# Patient Record
Sex: Male | Born: 1965
Health system: Southern US, Community
[De-identification: ages and names within clinical notes are randomized; demographics above are authoritative.]

## PROBLEM LIST (undated history)

## (undated) DIAGNOSIS — J45909 Unspecified asthma, uncomplicated: Secondary | ICD-10-CM

## (undated) DIAGNOSIS — B1920 Unspecified viral hepatitis C without hepatic coma: Secondary | ICD-10-CM

## (undated) DIAGNOSIS — F1010 Alcohol abuse, uncomplicated: Secondary | ICD-10-CM

## (undated) DIAGNOSIS — F1110 Opioid abuse, uncomplicated: Secondary | ICD-10-CM

## (undated) DIAGNOSIS — F1410 Cocaine abuse, uncomplicated: Secondary | ICD-10-CM

## (undated) HISTORY — PX: HAND SURGERY: SHX662

---

## 1999-03-24 ENCOUNTER — Emergency Department (HOSPITAL_COMMUNITY): Admission: EM | Admit: 1999-03-24 | Discharge: 1999-03-24 | Payer: Self-pay | Admitting: Emergency Medicine

## 1999-03-24 ENCOUNTER — Encounter: Payer: Self-pay | Admitting: Emergency Medicine

## 1999-03-25 ENCOUNTER — Encounter (INDEPENDENT_AMBULATORY_CARE_PROVIDER_SITE_OTHER): Payer: Self-pay | Admitting: Specialist

## 1999-03-25 ENCOUNTER — Ambulatory Visit (HOSPITAL_COMMUNITY): Admission: RE | Admit: 1999-03-25 | Discharge: 1999-03-25 | Payer: Self-pay | Admitting: Orthopedic Surgery

## 1999-03-25 ENCOUNTER — Encounter: Payer: Self-pay | Admitting: Orthopedic Surgery

## 2004-12-14 ENCOUNTER — Emergency Department (HOSPITAL_COMMUNITY): Admission: EM | Admit: 2004-12-14 | Discharge: 2004-12-14 | Payer: Self-pay | Admitting: Emergency Medicine

## 2007-01-16 ENCOUNTER — Emergency Department (HOSPITAL_COMMUNITY): Admission: EM | Admit: 2007-01-16 | Discharge: 2007-01-16 | Payer: Self-pay | Admitting: Emergency Medicine

## 2009-04-28 ENCOUNTER — Emergency Department (HOSPITAL_COMMUNITY): Admission: EM | Admit: 2009-04-28 | Discharge: 2009-04-28 | Payer: Self-pay | Admitting: Emergency Medicine

## 2010-11-30 ENCOUNTER — Emergency Department (HOSPITAL_BASED_OUTPATIENT_CLINIC_OR_DEPARTMENT_OTHER)
Admission: EM | Admit: 2010-11-30 | Discharge: 2010-11-30 | Disposition: A | Discharge status: Home / Self Care | Payer: Self-pay | Attending: Emergency Medicine | Admitting: Emergency Medicine

## 2010-11-30 DIAGNOSIS — J45909 Unspecified asthma, uncomplicated: Secondary | ICD-10-CM | POA: Insufficient documentation

## 2010-11-30 DIAGNOSIS — K2990 Gastroduodenitis, unspecified, without bleeding: Secondary | ICD-10-CM | POA: Insufficient documentation

## 2010-11-30 DIAGNOSIS — F17200 Nicotine dependence, unspecified, uncomplicated: Secondary | ICD-10-CM | POA: Insufficient documentation

## 2010-11-30 DIAGNOSIS — R109 Unspecified abdominal pain: Secondary | ICD-10-CM | POA: Insufficient documentation

## 2010-11-30 DIAGNOSIS — K2970 Gastritis, unspecified, without bleeding: Secondary | ICD-10-CM | POA: Insufficient documentation

## 2010-11-30 LAB — COMPREHENSIVE METABOLIC PANEL
Alkaline Phosphatase: 73 U/L (ref 39–117)
BUN: 10 mg/dL (ref 6–23)
CO2: 27 mEq/L (ref 19–32)
Chloride: 102 mEq/L (ref 96–112)
Creatinine, Ser: 0.9 mg/dL (ref 0.4–1.5)
GFR calc non Af Amer: >60 mL/min (ref >60)
Glucose, Bld: 87 mg/dL (ref 70–99)
Total Bilirubin: 0.6 mg/dL (ref 0.3–1.2)

## 2010-11-30 LAB — URINALYSIS, ROUTINE W REFLEX MICROSCOPIC
Bilirubin Urine: NEGATIVE
Glucose, UA: NEGATIVE mg/dL
Hgb urine dipstick: NEGATIVE
Ketones, ur: NEGATIVE mg/dL
Nitrite: NEGATIVE
Protein, ur: NEGATIVE mg/dL
Specific Gravity, Urine: 1.024 (ref 1.005–1.030)
Urobilinogen, UA: 0.2 mg/dL (ref 0.0–1.0)
pH: 6 (ref 5.0–8.0)

## 2010-11-30 LAB — DIFFERENTIAL
Basophils Absolute: 0 10*3/uL (ref 0.0–0.1)
Basophils Relative: 0 % (ref 0–1)
Eosinophils Absolute: 0 10*3/uL (ref 0.0–0.7)
Eosinophils Relative: 0 % (ref 0–5)
Lymphocytes Relative: 27 % (ref 12–46)
Lymphs Abs: 3 10*3/uL (ref 0.7–4.0)
Monocytes Absolute: 0.8 10*3/uL (ref 0.1–1.0)
Monocytes Relative: 7 % (ref 3–12)
Neutro Abs: 7.3 10*3/uL (ref 1.7–7.7)
Neutrophils Relative %: 66 % (ref 43–77)

## 2010-11-30 LAB — CBC
HCT: 40.8 % (ref 39.0–52.0)
Hemoglobin: 14.1 g/dL (ref 13.0–17.0)
MCH: 21.6 pg — ABNORMAL LOW (ref 26.0–34.0)
MCV: 62.6 fL — ABNORMAL LOW (ref 78.0–100.0)
RBC: 6.52 MIL/uL — ABNORMAL HIGH (ref 4.22–5.81)

## 2011-01-05 ENCOUNTER — Emergency Department (HOSPITAL_COMMUNITY)
Admission: EM | Admit: 2011-01-05 | Discharge: 2011-01-05 | Disposition: A | Discharge status: Home / Self Care | Payer: Self-pay | Attending: Emergency Medicine | Admitting: Emergency Medicine

## 2011-01-05 DIAGNOSIS — R209 Unspecified disturbances of skin sensation: Secondary | ICD-10-CM | POA: Insufficient documentation

## 2011-01-05 DIAGNOSIS — M549 Dorsalgia, unspecified: Secondary | ICD-10-CM | POA: Insufficient documentation

## 2011-01-05 DIAGNOSIS — M5430 Sciatica, unspecified side: Secondary | ICD-10-CM | POA: Insufficient documentation

## 2013-08-23 ENCOUNTER — Encounter (HOSPITAL_COMMUNITY): Payer: Self-pay | Admitting: Emergency Medicine

## 2013-08-23 ENCOUNTER — Emergency Department (HOSPITAL_COMMUNITY)
Admission: EM | Admit: 2013-08-23 | Discharge: 2013-08-23 | Disposition: A | Discharge status: Home / Self Care | Payer: Self-pay | Attending: Emergency Medicine | Admitting: Emergency Medicine

## 2013-08-23 ENCOUNTER — Emergency Department (HOSPITAL_COMMUNITY): Payer: Self-pay

## 2013-08-23 DIAGNOSIS — Z880 Allergy status to penicillin: Secondary | ICD-10-CM | POA: Insufficient documentation

## 2013-08-23 DIAGNOSIS — Z79899 Other long term (current) drug therapy: Secondary | ICD-10-CM | POA: Insufficient documentation

## 2013-08-23 DIAGNOSIS — J45909 Unspecified asthma, uncomplicated: Secondary | ICD-10-CM | POA: Insufficient documentation

## 2013-08-23 DIAGNOSIS — M549 Dorsalgia, unspecified: Secondary | ICD-10-CM

## 2013-08-23 DIAGNOSIS — R509 Fever, unspecified: Secondary | ICD-10-CM | POA: Insufficient documentation

## 2013-08-23 DIAGNOSIS — R110 Nausea: Secondary | ICD-10-CM | POA: Insufficient documentation

## 2013-08-23 DIAGNOSIS — N390 Urinary tract infection, site not specified: Secondary | ICD-10-CM | POA: Insufficient documentation

## 2013-08-23 DIAGNOSIS — R000 Tachycardia, unspecified: Secondary | ICD-10-CM | POA: Insufficient documentation

## 2013-08-23 HISTORY — DX: Unspecified asthma, uncomplicated: J45.909

## 2013-08-23 LAB — URINALYSIS, ROUTINE W REFLEX MICROSCOPIC
Bilirubin Urine: NEGATIVE
Glucose, UA: NEGATIVE mg/dL
KETONES UR: NEGATIVE mg/dL
NITRITE: NEGATIVE
PH: 6 (ref 5.0–8.0)
PROTEIN: NEGATIVE mg/dL
Specific Gravity, Urine: 1.026 (ref 1.005–1.030)
Urobilinogen, UA: 0.2 mg/dL (ref 0.0–1.0)

## 2013-08-23 LAB — CBC
HCT: 44.2 % (ref 39.0–52.0)
Hemoglobin: 14.5 g/dL (ref 13.0–17.0)
MCH: 21.2 pg — ABNORMAL LOW (ref 26.0–34.0)
MCHC: 32.8 g/dL (ref 30.0–36.0)
MCV: 64.6 fL — ABNORMAL LOW (ref 78.0–100.0)
Platelets: 210 K/uL (ref 150–400)
RBC: 6.84 MIL/uL — ABNORMAL HIGH (ref 4.22–5.81)
RDW: 16.2 % — ABNORMAL HIGH (ref 11.5–15.5)
WBC: 6.4 K/uL (ref 4.0–10.5)

## 2013-08-23 LAB — COMPREHENSIVE METABOLIC PANEL WITH GFR
ALT: 33 U/L (ref 0–53)
AST: 29 U/L (ref 0–37)
Albumin: 3.8 g/dL (ref 3.5–5.2)
Alkaline Phosphatase: 70 U/L (ref 39–117)
BUN: 9 mg/dL (ref 6–23)
CO2: 27 meq/L (ref 19–32)
Calcium: 9.4 mg/dL (ref 8.4–10.5)
Chloride: 103 meq/L (ref 96–112)
Creatinine, Ser: 1.05 mg/dL (ref 0.50–1.35)
GFR calc Af Amer: >90 mL/min
GFR calc non Af Amer: 83 mL/min — ABNORMAL LOW
Glucose, Bld: 109 mg/dL — ABNORMAL HIGH (ref 70–99)
Potassium: 3.8 meq/L (ref 3.7–5.3)
Sodium: 141 meq/L (ref 137–147)
Total Bilirubin: 0.3 mg/dL (ref 0.3–1.2)
Total Protein: 7.4 g/dL (ref 6.0–8.3)

## 2013-08-23 LAB — URINE MICROSCOPIC-ADD ON

## 2013-08-23 MED ORDER — CIPROFLOXACIN HCL 500 MG PO TABS: 500.0000 mg | ORAL_TABLET | Freq: Two times a day (BID) | ORAL | Status: DC

## 2013-08-23 MED ORDER — OXYCODONE-ACETAMINOPHEN 5-325 MG PO TABS: 1.0000 | ORAL_TABLET | Freq: Four times a day (QID) | ORAL | Status: DC | PRN

## 2013-08-23 MED ORDER — SODIUM CHLORIDE 0.9 % IV BOLUS (SEPSIS)
1000.0000 mL | Freq: Once | INTRAVENOUS | Status: AC
  Administered 2013-08-23: 1000 mL via INTRAVENOUS

## 2013-08-23 MED ORDER — ONDANSETRON HCL 4 MG/2ML IJ SOLN
4.0000 mg | Freq: Once | INTRAMUSCULAR | Status: AC
  Administered 2013-08-23: 4 mg via INTRAVENOUS
  Filled 2013-08-23: qty 2

## 2013-08-23 MED ORDER — HYDROMORPHONE HCL PF 1 MG/ML IJ SOLN
1.0000 mg | Freq: Once | INTRAMUSCULAR | Status: AC
  Administered 2013-08-23: 1 mg via INTRAVENOUS
  Filled 2013-08-23: qty 1

## 2013-08-23 NOTE — ED Provider Notes (Signed)
CSN: [id number]     Arrival date & time 08/23/13  0744 History   First MD Initiated Contact with Patient 08/23/13 0757     Chief Complaint  Patient presents with  . Flank Pain     (Consider location/radiation/quality/duration/timing/severity/associated sxs/prior Treatment) Patient is a 48 y.o. male presenting with flank pain and abdominal pain.  Flank Pain This is a new problem. The current episode started more than 1 week ago. The problem occurs constantly. The problem has been gradually worsening. Associated symptoms include abdominal pain. Pertinent negatives include no chest pain. Nothing aggravates the symptoms. Nothing relieves the symptoms. He has tried nothing for the symptoms.  Abdominal Pain Pain location:  L flank and R flank Pain quality: aching and dull   Pain radiates to:  LLQ Pain severity:  Moderate Onset quality:  Gradual Duration:  10 days Timing:  Constant Progression:  Worsening Chronicity:  New Context: not recent illness and not sick contacts   Relieved by:  Nothing Worsened by:  Nothing tried Associated symptoms: fever (subjective last night) and nausea   Associated symptoms: no chest pain and no vomiting     Past Medical History  Diagnosis Date  . Asthma    History reviewed. No pertinent past surgical history. No family history on file. History  Substance Use Topics  . Smoking status: Current Every Day Smoker -- 0.50 packs/day    Types: Cigarettes  . Smokeless tobacco: Not on file  . Alcohol Use: No    Review of Systems  Constitutional: Positive for fever (subjective last night).  Cardiovascular: Negative for chest pain and leg swelling.  Gastrointestinal: Positive for nausea and abdominal pain. Negative for vomiting.  Genitourinary: Positive for flank pain.  All other systems reviewed and are negative.      Allergies  Penicillins  Home Medications   Current Outpatient Rx  Name  Route  Sig  Dispense  Refill  . acyclovir (ZOVIRAX)  400 MG tablet   Oral   Take 400 mg by mouth daily.         Marland Kitchen. esomeprazole (NEXIUM) 20 MG capsule   Oral   Take 20 mg by mouth daily at 12 noon.         . Ginger, Zingiber officinalis, (GINGER ROOT) 250 MG CAPS   Oral   Take 250 mg by mouth daily.         Marland Kitchen. ibuprofen (ADVIL,MOTRIN) 200 MG tablet   Oral   Take 400 mg by mouth every 6 (six) hours as needed for mild pain or moderate pain.         . Multiple Vitamin (MULTIVITAMIN WITH MINERALS) TABS tablet   Oral   Take 1 tablet by mouth daily.         . Phenazopyridine HCl (AZO TABS PO)   Oral   Take 2 tablets by mouth 3 (three) times daily.         . vitamin B-12 (CYANOCOBALAMIN) 100 MCG tablet   Oral   Take 100 mcg by mouth daily.          BP 148/96  Pulse 129  Temp(Src) 98.5 F (36.9 C)  Resp 20  SpO2 100% Physical Exam  Nursing note and vitals reviewed. Constitutional: He is oriented to person, place, and time. He appears well-developed and well-nourished. No distress.  HENT:  Head: Normocephalic and atraumatic.  Mouth/Throat: No oropharyngeal exudate.  Eyes: EOM are normal. Pupils are equal, round, and reactive to light.  Neck: Normal range of  motion. Neck supple.  Cardiovascular: Normal rate and regular rhythm.  Exam reveals no friction rub.   No murmur heard. Pulmonary/Chest: Effort normal and breath sounds normal. No respiratory distress. He has no wheezes. He has no rales.  Abdominal: He exhibits no distension. There is tenderness (LLQ, L flank). There is no rebound.  Musculoskeletal: Normal range of motion. He exhibits no edema.  Neurological: He is alert and oriented to person, place, and time.  Skin: No rash noted. He is not diaphoretic.    ED Course  Procedures (including critical care time) Labs Review Labs Reviewed  CBC - Abnormal; Notable for the following:    RBC 6.84 (*)    MCV 64.6 (*)    MCH 21.2 (*)    RDW 16.2 (*)    All other components within normal limits  COMPREHENSIVE  METABOLIC PANEL - Abnormal; Notable for the following:    Glucose, Bld 109 (*)    GFR calc non Af Amer 83 (*)    All other components within normal limits  URINALYSIS, ROUTINE W REFLEX MICROSCOPIC - Abnormal; Notable for the following:    Color, Urine AMBER (*)    Hgb urine dipstick MODERATE (*)    Leukocytes, UA SMALL (*)    All other components within normal limits  URINE MICROSCOPIC-ADD ON   Imaging Review Ct Abdomen Pelvis Wo Contrast  08/23/2013   CLINICAL DATA:  Bilateral flank pain for 10 days.  Nausea.  EXAM: CT ABDOMEN AND PELVIS WITHOUT CONTRAST  TECHNIQUE: Multidetector CT imaging of the abdomen and pelvis was performed following the standard protocol without intravenous contrast.  COMPARISON:  None.  FINDINGS: The noncontrast CT appearance of the liver, spleen, pancreas, and adrenal glands is within normal limits. Gallbladder mildly contracted.  Kidneys and proximal ureters unremarkable. No distal ureteral calculus or bladder calculus observed.  Appendix normal.  Incidental calcifications in the central zone of the prostate gland.  Bridging spurring of the sacroiliac joints noted.  IMPRESSION: 1. 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 2. No acute findings to explain the patient's flank pain.   Electronically Signed   By: Herbie Baltimore M.D.   On: 08/23/2013 09:15    EKG Interpretation   None       MDM   Final diagnoses:  Back pain  UTI (lower urinary tract infection)    26M with no history of kidney stones presents with bilateral flank pain. Worse on the left. Subjective fever last night with nausea. No vomiting. He is having difficulty urinating but no dysuria. Here he is afebrile but tachycardic in the high 120s. Normal pressures. Patient is uncomfortable on exam but in no acute distress. He has no CVA tenderness bilaterally. He has some left lower quadrant and left flank pain on palpation. Concern for kidney stone. Will obtain stone study. Pain medicine,  fluids, nausea medicine given. On re-exam, feeling much better. Patient's pain is located in his musculature of his lower back. CT normal. No spinal pain. Likely musculoskeletal back pain. Will give Cipro with his difficulty urinating and small leukocytes on UA. Stable for discharge.  I have reviewed all labs and imaging and considered them in my medical decision making.   Dagmar Hait, MD 08/23/13 1022

## 2013-08-23 NOTE — Discharge Instructions (Signed)
Back Exercises Back exercises help treat and prevent back injuries. The goal of back exercises is to increase the strength of your abdominal and back muscles and the flexibility of your back. These exercises should be started when you no longer have back pain. Back exercises include:  Pelvic Tilt. Lie on your back with your knees bent. Tilt your pelvis until the lower part of your back is against the floor. Hold this position 5 to 10 sec and repeat 5 to 10 times.  Knee to Chest. Pull first 1 knee up against your chest and hold for 20 to 30 seconds, repeat this with the other knee, and then both knees. This may be done with the other leg straight or bent, whichever feels better.  Sit-Ups or Curl-Ups. Bend your knees 90 degrees. Start with tilting your pelvis, and do a partial, slow sit-up, lifting your trunk only 30 to 45 degrees off the floor. Take at least 2 to 3 seconds for each sit-up. Do not do sit-ups with your knees out straight. If partial sit-ups are difficult, simply do the above but with only tightening your abdominal muscles and holding it as directed.  Hip-Lift. Lie on your back with your knees flexed 90 degrees. Push down with your feet and shoulders as you raise your hips a couple inches off the floor; hold for 10 seconds, repeat 5 to 10 times.  Back arches. Lie on your stomach, propping yourself up on bent elbows. Slowly press on your hands, causing an arch in your low back. Repeat 3 to 5 times. Any initial stiffness and discomfort should lessen with repetition over time.  Shoulder-Lifts. Lie face down with arms beside your body. Keep hips and torso pressed to floor as you slowly lift your head and shoulders off the floor. Do not overdo your exercises, especially in the beginning. Exercises may cause you some mild back discomfort which lasts for a few minutes; however, if the pain is more severe, or lasts for more than 15 minutes, do not continue exercises until you see your caregiver.  Improvement with exercise therapy for back problems is slow.  See your caregivers for assistance with developing a proper back exercise program. Document Released: 08/05/2004 Document Revised: 09/20/2011 Document Reviewed: 04/29/2011 [facility] Patient Information 2014 [place], [place].  Urinary Tract Infection Urinary tract infections (UTIs) can develop anywhere along your urinary tract. Your urinary tract is your body's drainage system for removing wastes and extra water. Your urinary tract includes two kidneys, two ureters, a bladder, and a urethra. Your kidneys are a pair of bean-shaped organs. Each kidney is about the size of your fist. They are located below your ribs, one on each side of your spine. CAUSES Infections are caused by microbes, which are microscopic organisms, including fungi, viruses, and bacteria. These organisms are so small that they can only be seen through a microscope. Bacteria are the microbes that most commonly cause UTIs. SYMPTOMS  Symptoms of UTIs may vary by age and gender of the patient and by the location of the infection. Symptoms in young women typically include a frequent and intense urge to urinate and a painful, burning feeling in the bladder or urethra during urination. Older women and men are more likely to be tired, shaky, and weak and have muscle aches and abdominal pain. A fever may mean the infection is in your kidneys. Other symptoms of a kidney infection include pain in your back or sides below the ribs, nausea, and vomiting. DIAGNOSIS To diagnose a  UTI, your caregiver will ask you about your symptoms. Your caregiver also will ask to provide a urine sample. The urine sample will be tested for bacteria and white blood cells. White blood cells are made by your body to help fight infection. TREATMENT  Typically, UTIs can be treated with medication. Because most UTIs are caused by a bacterial infection, they usually can be treated with the use of antibiotics. The  choice of antibiotic and length of treatment depend on your symptoms and the type of bacteria causing your infection. HOME CARE INSTRUCTIONS  If you were prescribed antibiotics, take them exactly as your caregiver instructs you. Finish the medication even if you feel better after you have only taken some of the medication.  Drink enough water and fluids to keep your urine clear or pale yellow.  Avoid caffeine, tea, and carbonated beverages. They tend to irritate your bladder.  Empty your bladder often. Avoid holding urine for long periods of time.  Empty your bladder before and after sexual intercourse.  After a bowel movement, women should cleanse from front to back. Use each tissue only once. SEEK MEDICAL CARE IF:   You have back pain.  You develop a fever.  Your symptoms do not begin to resolve within 3 days. SEEK IMMEDIATE MEDICAL CARE IF:   You have severe back pain or lower abdominal pain.  You develop chills.  You have nausea or vomiting.  You have continued burning or discomfort with urination. MAKE SURE YOU:   Understand these instructions.  Will watch your condition.  Will get help right away if you are not doing well or get worse. Document Released: 04/07/2005 Document Revised: 12/28/2011 Document Reviewed: 08/06/2011 [facility] Patient Information 2014 [place], [place].   Emergency Department Resource Guide 1) Find a Doctor and Pay Out of Pocket Although you won't have to find out who is covered by your insurance plan, it is a good idea to ask around and get recommendations. You will then need to call the office and see if the doctor you have chosen will accept you as a new patient and what types of options they offer for patients who are self-pay. Some doctors offer discounts or will set up payment plans for their patients who do not have insurance, but you will need to ask so you aren't surprised when you get to your appointment.  2) Contact Your Local Health  Department Not all health departments have doctors that can see patients for sick visits, but many do, so it is worth a call to see if yours does. If you don't know where your local health department is, you can check in your phone book. The CDC also has a tool to help you locate your state's health department, and many state websites also have listings of all of their local health departments.  3) Find a Walk-in Clinic If your illness is not likely to be very severe or complicated, you may want to try a walk in clinic. These are popping up all over the country in pharmacies, drugstores, and shopping centers. They're usually staffed by nurse practitioners or physician assistants that have been trained to treat common illnesses and complaints. They're usually fairly quick and inexpensive. However, if you have serious medical issues or chronic medical problems, these are probably not your best option.  No Primary Care Doctor: - Call Health Connect at  [phone] - they can help you locate a primary care doctor that  accepts your insurance, provides certain services, etc. -  Physician Referral Service- [phone]  Chronic Pain Problems: Organization         Address  Phone   Notes  [facility]  [phone] Patients need to be referred by their primary care doctor.   Medication Assistance: Organization         Address  Phone   Notes  [facility] Medication [facility] [address] [place], [place] [address] [phone] --Must be a resident of [facility] -- Must have NO insurance coverage whatsoever (no Medicaid/ Medicare, etc.) -- The pt. MUST have a primary care doctor that directs their care regularly and follows them in the community   MedAssist  [phone]   [employer]  [phone]    Agencies that provide inexpensive medical care: Organization         Address  Phone   Notes  [facility]  [phone]   [facility]    [phone]   [facility] [address] [place], [place] [address] [phone]   [facility] [address], [place] [phone]) [phone]   Planned Parenthood    [phone](336) [phone]   [facility]    [phone]) [phone]   Community Health and [facility]  [address], [place] Phone:  [phone], Fax:  [phone]) [phone] Hours of Operation:  9 am - 6 pm, M-F.  Also accepts Medicaid/Medicare and self-pay.  [facility]  [address], [place] Phone: [phone]) [phone], Fax: [phone]. Hours of Operation:  8:30 am - 5:30 pm, M-F.  Also accepts Medicaid and self-pay.  [facility] [place] [address], [place] Phone: [phone](336) [phone]   [facility] [address], [place], [place] [phone], Ext. [phone] Mondays & Thursdays: 7-9 AM.  First 15 patients are seen on a first come, first serve basis.    Medicaid-accepting [facility] Providers:  Organization         Address  Phone   Notes  [facility] [address], [place] [phone] Also accepts self-pay patients.  [facility] [address], [place]  [phone]   [facility] [address], [place] [phone]   [facility] [address] [place] [address], [place] [phone]   Renaye RakersVeita Bland [address], [place]   [phone] Only accepts [place] Access [place] patients after they have their name applied to their card.   Self-Pay (no insurance) in [facility]:  Organization         Address  Phone   Notes  Sickle Cell Patients, [facility] [address], [place] [phone]   [facility] [address], [place] [phone]   [facility] [place]  [address],  [place] [phone]   [facility]/Dr. Osei-Bonsu  [address] [place] [address], [place] or [phone], [place] [phone]) [phone] Phone number for both [place] and [place] locations is the same.  Urgent Medical and [facility] [address], [place] [phone]) [phone]   [facility] [place] [address] [place] [address], [place] or [address] [phone]   [facility] [address], [place] [phone]) [phone],  phone; [phone], fax Sees patients 1st and 3rd Saturday of every month.  Must not qualify for public or private insurance (i.e. Medicaid, Medicare, [place] Health Choice, Veterans' Benefits)  Household income should be no more than 200% of the poverty level The clinic cannot treat you if you are pregnant or think you are pregnant  Sexually transmitted diseases are not treated at the clinic.    Dental Care: Organization         Address  Phone  Notes  [facility] of [facility] [phone] Accepts children up to age 21 who are enrolled in [place] or [place]; pregnant women with a Medicaid card; and children who have applied for Medicaid or [place] Health Choice, but were declined, whose parents can pay a reduced fee at time of service.  [facility] of [facility]  [address], [place] [phone] Accepts children up to age 68 who are enrolled in [place] or [place]; pregnant women with a Medicaid card; and children who have applied for Medicaid or [place] Health Choice, but were declined, whose parents can pay a reduced fee at time of service.  [place] Adult Dental Access PROGRAM  [place] [phone] Patients are seen by appointment only. Walk-ins are not accepted. [place] will see patients 77 years of age and older. Monday - Tuesday (8am-5pm) Most Wednesdays  (8:30-5pm) $30 per visit, cash only  [facility] Adult Dental Access PROGRAM  [address], [facility] [phone] Patients are seen by appointment only. Walk-ins are not accepted. [place] will see patients 28 years of age and older. One Wednesday Evening (Monthly: Volunteer Based).  $30 per visit, cash only  [place]  [phone] for adults; Children under age 32, call Graduate Pediatric Dentistry at [phone]. Children aged 54-14, please call [phone] to request a pediatric application.  Dental services are provided in all areas of dental care including fillings, crowns and bridges, complete and partial dentures, implants, gum treatment, root canals, and extractions. Preventive care is also provided. Treatment is provided to both adults and children. Patients are selected via a lottery and there is often a waiting list.   [facility] [address] [place]  [phone] [email or web address]   [facility] [address] [place], [place] [phone], Ext. [phone] Second and Fourth Thursday of each month, opens at 6:30 AM; Clinic ends at 9 AM.  Patients are seen on a first-come first-served basis, and a limited number are seen during each clinic.   [facility]  [address] Hillard Danker [place], [place] [phone]   Eligibility Requirements You must have lived in [place], [place], or [place] counties for at least the last three months.   You cannot be eligible for state or federal sponsored [employer], including [employer], [place], or [employer].   You generally cannot be eligible for healthcare insurance through your employer.    How to apply: Eligibility screenings are held every Tuesday and Wednesday afternoon from 1:00 pm until 4:00 pm. You do not need an appointment for the interview!  [facility] [address] [place], [place]   [place]  [place] Department  Page  [phone]    Behavioral Health Resources in  the Community: Intensive Outpatient Programs Organization         Address  Phone  Notes  [place]. [address], [place], [place] [phone]   [facility] [address], [place], [place]   ADS: Alcohol & Drug Svcs [address] [place] [address] [place], [place]   [place] [address]  [place], [place] or [phone]   Substance Abuse Resources Organization         Address  Phone  Notes  Alcohol and Drug Services  5047351452   [place]  [phone]   The [place]   Chinita Pester  [phone]   Residential & Outpatient Substance Abuse Program  [phone]   Psychological Services Organization         Address  Phone  Notes  [facility] [place]  [place]  [phone]   [place] [address] [place] or [phone]    Mobile Crisis Teams Organization         Address  Phone  Notes  [facility], Mobile Crisis Care Unit  [phone]   [facility]  [address] [place], [place]   [place] Levels [address], [place] [phone]    Self-Help/Support Groups Organization         Address  Phone             Notes  [place]. of [place] - variety of support groups  Bonita Call for more information  Narcotics Anonymous (NA), [facility]aring [facility] [address], [employer] [place]  2 meetings at this location   Special educational needs teacher         Address  Phone  Notes  ASAP Residential Treatment [place],    [place]  [phone]   [facility]  [address] [place] 694854, [place], [place]   [place], [place] [phone] Admissions: 8am-3pm M-F  Incentives Substance [place] [address],    [place], [place] [phone]   [facility] [address] [place], [place], [place]   [facility] [address]  [place], [place]   Insight Programs - Intensive Outpatient [address], [place], [place]   [facility] ([place].) [place].,  [place], [place] [phone] or [phone]   [facility] Services (RTS) [address] [place], [place] Accepts Medicaid  [facility] [place] [address]  [place] [phone] Substance Abuse/Addiction Treatment   [facility] Organization         Address  Phone  Notes  [facility]  [phone]   Domenic Schwab, PhD [address] Arlis Porta [place], [place]   [phone] or [phone]   [place], [place] [phone]   [place] [address] [place], [place] [phone] Insurance/Medicaid/sponsorship through [employer] and Families [address] [place]                                    [place], [place] [phone] [place] [address], [place] [phone]  Dr. Adele Schilder  [phone]   [facility] Dept. 1) [address], [place] 2) [place] 3)  [place] [address], [place] [phone]  [phone]   [place] [phone] or [phone] (After Hours)

## 2013-08-23 NOTE — ED Notes (Signed)
Pt c/o bilateral flank pain x 10 days; frequent urination; doesn't feel like emptying bladder; fever yesterday; pain worse right flank; no hematuria

## 2013-08-23 NOTE — Progress Notes (Signed)
[facility] CL provided pt with a list of primary care resources, ACA information, and a GCC[facility] [place] Card application to help patient establish primary care.  °

## 2015-02-05 ENCOUNTER — Encounter (HOSPITAL_COMMUNITY): Payer: Self-pay | Admitting: Emergency Medicine

## 2015-02-05 ENCOUNTER — Emergency Department (HOSPITAL_COMMUNITY)
Admission: EM | Admit: 2015-02-05 | Discharge: 2015-02-05 | Disposition: A | Discharge status: Home / Self Care | Payer: Self-pay | Attending: Emergency Medicine | Admitting: Emergency Medicine

## 2015-02-05 DIAGNOSIS — Y92481 Parking lot as the place of occurrence of the external cause: Secondary | ICD-10-CM | POA: Insufficient documentation

## 2015-02-05 DIAGNOSIS — R55 Syncope and collapse: Secondary | ICD-10-CM | POA: Insufficient documentation

## 2015-02-05 DIAGNOSIS — Y9389 Activity, other specified: Secondary | ICD-10-CM | POA: Insufficient documentation

## 2015-02-05 DIAGNOSIS — S199XXA Unspecified injury of neck, initial encounter: Secondary | ICD-10-CM | POA: Insufficient documentation

## 2015-02-05 DIAGNOSIS — Z720 Tobacco use: Secondary | ICD-10-CM | POA: Insufficient documentation

## 2015-02-05 DIAGNOSIS — R569 Unspecified convulsions: Secondary | ICD-10-CM | POA: Insufficient documentation

## 2015-02-05 DIAGNOSIS — Z79899 Other long term (current) drug therapy: Secondary | ICD-10-CM | POA: Insufficient documentation

## 2015-02-05 DIAGNOSIS — J45901 Unspecified asthma with (acute) exacerbation: Secondary | ICD-10-CM | POA: Insufficient documentation

## 2015-02-05 DIAGNOSIS — Y998 Other external cause status: Secondary | ICD-10-CM | POA: Insufficient documentation

## 2015-02-05 DIAGNOSIS — Z880 Allergy status to penicillin: Secondary | ICD-10-CM | POA: Insufficient documentation
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
## 2015-03-20 ENCOUNTER — Emergency Department (HOSPITAL_COMMUNITY)
Admission: EM | Admit: 2015-03-20 | Discharge: 2015-03-20 | Disposition: A | Discharge status: Home / Self Care | Payer: Self-pay | Attending: Emergency Medicine | Admitting: Emergency Medicine

## 2015-03-20 ENCOUNTER — Emergency Department (HOSPITAL_COMMUNITY): Payer: Self-pay

## 2015-03-20 ENCOUNTER — Encounter (HOSPITAL_COMMUNITY): Payer: Self-pay

## 2015-03-20 DIAGNOSIS — J45909 Unspecified asthma, uncomplicated: Secondary | ICD-10-CM | POA: Insufficient documentation

## 2015-03-20 DIAGNOSIS — S46001A Unspecified injury of muscle(s) and tendon(s) of the rotator cuff of right shoulder, initial encounter: Secondary | ICD-10-CM | POA: Insufficient documentation

## 2015-03-20 DIAGNOSIS — Y998 Other external cause status: Secondary | ICD-10-CM | POA: Insufficient documentation

## 2015-03-20 DIAGNOSIS — Y9389 Activity, other specified: Secondary | ICD-10-CM | POA: Insufficient documentation

## 2015-03-20 DIAGNOSIS — Z79899 Other long term (current) drug therapy: Secondary | ICD-10-CM | POA: Insufficient documentation

## 2015-03-20 DIAGNOSIS — Z720 Tobacco use: Secondary | ICD-10-CM | POA: Insufficient documentation

## 2015-03-20 DIAGNOSIS — X58XXXA Exposure to other specified factors, initial encounter: Secondary | ICD-10-CM | POA: Insufficient documentation

## 2015-03-20 DIAGNOSIS — Z7951 Long term (current) use of inhaled steroids: Secondary | ICD-10-CM | POA: Insufficient documentation

## 2015-03-20 DIAGNOSIS — Y9289 Other specified places as the place of occurrence of the external cause: Secondary | ICD-10-CM | POA: Insufficient documentation

## 2015-03-20 DIAGNOSIS — Z880 Allergy status to penicillin: Secondary | ICD-10-CM | POA: Insufficient documentation

## 2015-03-20 MED ORDER — FENTANYL CITRATE (PF) 100 MCG/2ML IJ SOLN
50.0000 ug | Freq: Once | INTRAMUSCULAR | Status: AC
  Administered 2015-03-20: 50 ug via INTRAVENOUS
  Filled 2015-03-20: qty 2

## 2015-03-20 MED ORDER — HYDROMORPHONE HCL 1 MG/ML IJ SOLN
1.0000 mg | Freq: Once | INTRAMUSCULAR | Status: AC
  Administered 2015-03-20: 1 mg via INTRAVENOUS
  Filled 2015-03-20: qty 1

## 2015-03-20 MED ORDER — HYDROMORPHONE HCL 1 MG/ML IJ SOLN: 1.0000 mg | Freq: Once | INTRAMUSCULAR | Status: DC

## 2015-03-20 MED ORDER — NAPROXEN 500 MG PO TABS: 500.0000 mg | ORAL_TABLET | Freq: Two times a day (BID) | ORAL | Status: DC

## 2015-03-20 MED ORDER — OXYCODONE-ACETAMINOPHEN 5-325 MG PO TABS: 1.0000 | ORAL_TABLET | Freq: Four times a day (QID) | ORAL | Status: DC | PRN

## 2015-03-20 NOTE — ED Provider Notes (Signed)
CSN: [id number]     Arrival date & time 03/20/15  1713 History   First MD Initiated Contact with Patient 03/20/15 1755     Chief Complaint  Patient presents with  . Shoulder Injury   HPI Patient presents to the emergency room with complaints of right shoulder pain. Patient states he was lifting up a 50 pound bag when he felt a tearing in severe pain in his right shoulder. That time the pain has been severe and he has difficulty moving his right arm. The pain is sharp and resolving down towards his hand. He denies any neck pain. He denies any weakness. Any palpation at all of the shoulder area causes severe pain. Past Medical History  Diagnosis Date  . Asthma    History reviewed. No pertinent past surgical history. History reviewed. No pertinent family history. Social History  Substance Use Topics  . Smoking status: Current Every Day Smoker -- 0.50 packs/day    Types: Cigarettes  . Smokeless tobacco: None  . Alcohol Use: Yes    Review of Systems  All other systems reviewed and are negative.     Allergies  Penicillins  Home Medications   Prior to Admission medications   Medication Sig Start Date End Date Taking? Authorizing Provider  acyclovir (ZOVIRAX) 400 MG tablet Take 400 mg by mouth daily.   Yes Historical Provider, MD  albuterol-ipratropium (COMBIVENT) 18-103 MCG/ACT inhaler Inhale 2 puffs into the lungs every 4 (four) hours as needed for wheezing or shortness of breath.   Yes Historical Provider, MD  Aspirin-Salicylamide-Caffeine (BC HEADACHE POWDER PO) Take 1 packet by mouth 2 (two) times daily as needed (headache).   Yes Historical Provider, MD  Multiple Vitamin (MULTIVITAMIN WITH MINERALS) TABS tablet Take 1 tablet by mouth daily.   Yes Historical Provider, MD  vitamin B-12 (CYANOCOBALAMIN) 100 MCG tablet Take 100 mcg by mouth daily.   Yes Historical Provider, MD  ciprofloxacin (CIPRO) 500 MG tablet Take 1 tablet (500 mg total) by mouth 2 (two) times daily. One po bid x  7 days Patient not taking: Reported on 03/20/2015 08/23/13   Elwin Mocha, MD  oxyCODONE-acetaminophen (PERCOCET) 5-325 MG per tablet Take 1 tablet by mouth every 6 (six) hours as needed for moderate pain. Patient not taking: Reported on 03/20/2015 08/23/13   Elwin Mocha, MD   BP 139/88 mmHg  Pulse 96  Temp(Src) 98.3 F (36.8 C) (Oral)  Resp 18  Wt 175 lb (79.379 kg)  SpO2 98% Physical Exam  Constitutional: He appears well-developed and well-nourished. No distress.  HENT:  Head: Normocephalic and atraumatic.  Right Ear: External ear normal.  Left Ear: External ear normal.  Eyes: Conjunctivae are normal. Right eye exhibits no discharge. Left eye exhibits no discharge. No scleral icterus.  Neck: Neck supple. No tracheal deviation present.  Cardiovascular: Normal rate.   Pulmonary/Chest: Effort normal. No stridor. No respiratory distress.  Musculoskeletal: He exhibits no edema.       Right shoulder: He exhibits decreased range of motion, tenderness and bony tenderness. He exhibits no swelling, no effusion, no crepitus, no deformity, no laceration, normal pulse and normal strength.       Cervical back: Normal. He exhibits no tenderness.  Neurological: He is alert. Cranial nerve deficit: no gross deficits.  Skin: Skin is warm and dry. No rash noted.  Psychiatric: He has a normal mood and affect.  Nursing note and vitals reviewed.   ED Course  Procedures (including critical care time) Labs Review Labs Reviewed -  No data to display  Imaging Review Dg Shoulder Right  03/20/2015   CLINICAL DATA:  49 year old male with right shoulder pain.  EXAM: RIGHT SHOULDER - 2+ VIEW  COMPARISON:  None.  FINDINGS: There is no evidence of fracture or dislocation. There is no evidence of arthropathy or other focal bone abnormality. Soft tissues are unremarkable.  IMPRESSION: No fracture or dislocation.   Electronically Signed   By: Elgie Collard M.D.   On: 03/20/2015 18:26   I have personally reviewed  and evaluated these images and lab results as part of my medical decision-making.  Medications  fentaNYL (SUBLIMAZE) injection 50 mcg (50 mcg Intravenous Given 03/20/15 1750)  HYDROmorphone (DILAUDID) injection 1 mg (1 mg Intravenous Given 03/20/15 1848)    MDM   Final diagnoses:  Rotator cuff injury, right, initial encounter    The patient's x-rays do not show any acute abnormality. I suspect a rotator cuff injury. Patient was given a dose of pain medications in the emergency department. Plan on discharge home with medications for pain, a sling and orthopedic referral.    Linwood Dibbles, MD 03/22/15 [phone]

## 2015-03-20 NOTE — Discharge Instructions (Signed)
Rotator Cuff Injury Rotator cuff injury is any type of injury to the set of muscles and tendons that make up the stabilizing unit of your shoulder. This unit holds the ball of your upper arm bone (humerus) in the socket of your shoulder blade (scapula).  CAUSES Injuries to your rotator cuff most commonly come from sports or activities that cause your arm to be moved repeatedly over your head. Examples of this include throwing, weight lifting, swimming, or racquet sports. Long lasting (chronic) irritation of your rotator cuff can cause soreness and swelling (inflammation), bursitis, and eventual damage to your tendons, such as a tear (rupture). SIGNS AND SYMPTOMS Acute rotator cuff tear:  Sudden tearing sensation followed by severe pain shooting from your upper shoulder down your arm toward your elbow.  Decreased range of motion of your shoulder because of pain and muscle spasm.  Severe pain.  Inability to raise your arm out to the side because of pain and loss of muscle power (large tears). Chronic rotator cuff tear:  Pain that usually is worse at night and may interfere with sleep.  Gradual weakness and decreased shoulder motion as the pain worsens.  Decreased range of motion. Rotator cuff tendinitis:  Deep ache in your shoulder and the outside upper arm over your shoulder.  Pain that comes on gradually and becomes worse when lifting your arm to the side or turning it inward. DIAGNOSIS Rotator cuff injury is diagnosed through a medical history, physical exam, and imaging exam. The medical history helps determine the type of rotator cuff injury. Your health care provider will look at your injured shoulder, feel the injured area, and ask you to move your shoulder in different positions. X-ray exams typically are done to rule out other causes of shoulder pain, such as fractures. MRI is the exam of choice for the most severe shoulder injuries because the images show muscles and tendons.    TREATMENT  Chronic tear:  Medicine for pain, such as acetaminophen or ibuprofen.  Physical therapy and range-of-motion exercises may be helpful in maintaining shoulder function and strength.  Steroid injections into your shoulder joint.  Surgical repair of the rotator cuff if the injury does not heal with noninvasive treatment. Acute tear:  Anti-inflammatory medicines such as ibuprofen and naproxen to help reduce pain and swelling.  A sling to help support your arm and rest your rotator cuff muscles. Long-term use of a sling is not advised. It may cause significant stiffening of the shoulder joint.  Surgery may be considered within a few weeks, especially in younger, active people, to return the shoulder to full function.  Indications for surgical treatment include the following:  Age younger than 60 years.  Rotator cuff tears that are complete.  Physical therapy, rest, and anti-inflammatory medicines have been used for 6-8 weeks, with no improvement.  Employment or sporting activity that requires constant shoulder use. Tendinitis:  Anti-inflammatory medicines such as ibuprofen and naproxen to help reduce pain and swelling.  A sling to help support your arm and rest your rotator cuff muscles. Long-term use of a sling is not advised. It may cause significant stiffening of the shoulder joint.  Severe tendinitis may require:  Steroid injections into your shoulder joint.  Physical therapy.  Surgery. HOME CARE INSTRUCTIONS   Apply ice to your injury:  Put ice in a plastic bag.  Place a towel between your skin and the bag.  Leave the ice on for 20 minutes, 2-3 times a day.  If you   have a shoulder immobilizer (sling and straps), wear it until told otherwise by your health care provider.  You may want to sleep on several pillows or in a recliner at night to lessen swelling and pain.  Only take over-the-counter or prescription medicines for pain, discomfort, or fever as  directed by your health care provider.  Do simple hand squeezing exercises with a soft rubber ball to decrease hand swelling. SEEK MEDICAL CARE IF:   Your shoulder pain increases, or new pain or numbness develops in your arm, hand, or fingers.  Your hand or fingers are colder than your other hand. SEEK IMMEDIATE MEDICAL CARE IF:   Your arm, hand, or fingers are numb or tingling.  Your arm, hand, or fingers are increasingly swollen and painful, or they turn white or blue. MAKE SURE YOU:  Understand these instructions.  Will watch your condition.  Will get help right away if you are not doing well or get worse. Document Released: 06/25/2000 Document Revised: 07/03/2013 Document Reviewed: 02/07/2013 Exit[employer] Patient Information 2015 [employer]. This information is not intended to replace advice given to you by your health care provider. Make sure you discuss any questions you have with your health care provider.  

## 2015-03-20 NOTE — ED Notes (Signed)
Right shoulder deformity s/p picking up 50 lbs bag.

## 2015-03-22 ENCOUNTER — Emergency Department (HOSPITAL_COMMUNITY)
Admission: EM | Admit: 2015-03-22 | Discharge: 2015-03-22 | Discharge status: Against Medical Advice | Payer: Self-pay | Attending: Emergency Medicine | Admitting: Emergency Medicine

## 2015-03-22 ENCOUNTER — Encounter (HOSPITAL_COMMUNITY): Payer: Self-pay | Admitting: Emergency Medicine

## 2015-03-22 DIAGNOSIS — R200 Anesthesia of skin: Secondary | ICD-10-CM | POA: Insufficient documentation

## 2015-03-22 DIAGNOSIS — J45909 Unspecified asthma, uncomplicated: Secondary | ICD-10-CM | POA: Insufficient documentation

## 2015-03-22 DIAGNOSIS — Z720 Tobacco use: Secondary | ICD-10-CM | POA: Insufficient documentation

## 2015-03-22 DIAGNOSIS — M79601 Pain in right arm: Secondary | ICD-10-CM | POA: Insufficient documentation

## 2015-03-22 NOTE — ED Notes (Signed)
Pt reports to registration he is leaving

## 2015-03-22 NOTE — ED Notes (Addendum)
Pt was seen on 9/8 here for "torn rotator cuff'. Pt states it has gotten worse, c/o right arm pain and numbness.

## 2015-07-14 ENCOUNTER — Emergency Department (HOSPITAL_COMMUNITY)
Admission: EM | Admit: 2015-07-14 | Discharge: 2015-07-14 | Disposition: A | Discharge status: Home / Self Care | Payer: Self-pay | Attending: Emergency Medicine | Admitting: Emergency Medicine

## 2015-07-14 ENCOUNTER — Encounter (HOSPITAL_COMMUNITY): Payer: Self-pay | Admitting: Emergency Medicine

## 2015-07-14 DIAGNOSIS — Z791 Long term (current) use of non-steroidal anti-inflammatories (NSAID): Secondary | ICD-10-CM | POA: Insufficient documentation

## 2015-07-14 DIAGNOSIS — J45909 Unspecified asthma, uncomplicated: Secondary | ICD-10-CM | POA: Insufficient documentation

## 2015-07-14 DIAGNOSIS — F17210 Nicotine dependence, cigarettes, uncomplicated: Secondary | ICD-10-CM | POA: Insufficient documentation

## 2015-07-14 DIAGNOSIS — Z880 Allergy status to penicillin: Secondary | ICD-10-CM | POA: Insufficient documentation

## 2015-07-14 DIAGNOSIS — Z79899 Other long term (current) drug therapy: Secondary | ICD-10-CM | POA: Insufficient documentation

## 2015-07-14 DIAGNOSIS — L03113 Cellulitis of right upper limb: Secondary | ICD-10-CM | POA: Insufficient documentation

## 2015-07-14 MED ORDER — CEPHALEXIN 500 MG PO CAPS: 500.0000 mg | ORAL_CAPSULE | Freq: Four times a day (QID) | ORAL | Status: DC

## 2015-07-14 MED ORDER — LIDOCAINE HCL 2 % IJ SOLN
INTRAMUSCULAR | Status: AC
  Filled 2015-07-14: qty 20

## 2015-07-14 MED ORDER — CEFTRIAXONE SODIUM 1 G IJ SOLR
1.0000 g | Freq: Once | INTRAMUSCULAR | Status: AC
  Administered 2015-07-14: 1 g via INTRAMUSCULAR
  Filled 2015-07-14: qty 10

## 2015-07-14 MED ORDER — LIDOCAINE-EPINEPHRINE (PF) 2 %-1:200000 IJ SOLN: 20.0000 mL | Freq: Once | INTRAMUSCULAR | Status: DC

## 2015-07-14 MED ORDER — HYDROCODONE-ACETAMINOPHEN 5-325 MG PO TABS: 2.0000 | ORAL_TABLET | ORAL | Status: DC | PRN

## 2015-07-14 MED ORDER — SULFAMETHOXAZOLE-TRIMETHOPRIM 800-160 MG PO TABS: 1.0000 | ORAL_TABLET | Freq: Two times a day (BID) | ORAL | Status: AC

## 2015-07-14 MED ORDER — LIDOCAINE-EPINEPHRINE 2 %-1:100000 IJ SOLN
INTRAMUSCULAR | Status: AC
  Administered 2015-07-14: 1 mL
  Filled 2015-07-14: qty 1

## 2015-07-14 MED ORDER — HYDROCODONE-ACETAMINOPHEN 5-325 MG PO TABS
1.0000 | ORAL_TABLET | Freq: Once | ORAL | Status: AC
  Administered 2015-07-14: 1 via ORAL
  Filled 2015-07-14: qty 1

## 2015-07-14 NOTE — ED Provider Notes (Signed)
CSN: [id number]     Arrival date & time 07/14/15  1309 History  By signing my name below, I, [facility] Randall, attest that this documentation has been prepared under the direction and in the presence of Devin StanleyJamie Chantee Cerino, PA-C. Electronically Signed: Randell PatientMarrissa Randall, ED Scribe. 07/14/2015. 2:47 PM.   Chief Complaint  Patient presents with  . Abscess   The history is provided by the patient. No language interpreter was used.   HPI Comments: Devin Randall is a 50 y.o. male who presents to the Emergency Department complaining of a constant, moderately painful, gradually worsening, pruritic area of swelling to the right medial forearm onset 2 days ago after a possible insect bite. 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 He notes that he did see spiders in the area but denies seeing any insects bite him. He endorses itchiness that same night and gradually spreading erythema around the area, heat, pressure, pain, and white, creamy, thick discharge from the area this morning. Per patient, he has cleaned the area, applied polysporin, liquid Band-Aid and an adhesive bandage without relief. He denies fever.  Past Medical History  Diagnosis Date  . Asthma    History reviewed. No pertinent past surgical history. History reviewed. No pertinent family history. Social History  Substance Use Topics  . Smoking status: Current Every Day Smoker -- 0.50 packs/day    Types: Cigarettes  . Smokeless tobacco: None  . Alcohol Use: Yes    Review of Systems  Constitutional: Negative for fever.  Skin: Positive for color change (Erythema to surrounding area on right medial forearm) and wound (Abscess to right medial forearm, lesion in center of abscess).      Allergies  Penicillins  Home Medications   Prior to Admission medications   Medication Sig Start Date End Date Taking? Authorizing Provider  acyclovir  (ZOVIRAX) 400 MG tablet Take 400 mg by mouth daily.    Historical Provider, MD  albuterol-ipratropium (COMBIVENT) 18-103 MCG/ACT inhaler Inhale 2 puffs into the lungs every 4 (four) hours as needed for wheezing or shortness of breath.    Historical Provider, MD  cephALEXin (KEFLEX) 500 MG capsule Take 1 capsule (500 mg total) by mouth 4 (four) times daily. 07/14/15   Chase PicketJaime Pilcher Kyna Blahnik, PA-C  HYDROcodone-acetaminophen (NORCO/VICODIN) 5-325 MG tablet Take 2 tablets by mouth every 4 (four) hours as needed. 07/14/15   Chase PicketJaime Pilcher Dymond Gutt, PA-C  Multiple Vitamin (MULTIVITAMIN WITH MINERALS) TABS tablet Take 1 tablet by mouth daily.    Historical Provider, MD  naproxen (NAPROSYN) 500 MG tablet Take 1 tablet (500 mg total) by mouth 2 (two) times daily. 03/20/15   Linwood DibblesJon Knapp, MD  oxyCODONE-acetaminophen (PERCOCET) 5-325 MG per tablet Take 1 tablet by mouth every 6 (six) hours as needed for moderate pain. 03/20/15   Linwood DibblesJon Knapp, MD  sulfamethoxazole-trimethoprim (BACTRIM DS,SEPTRA DS) 800-160 MG tablet Take 1 tablet by mouth 2 (two) times daily. 07/14/15 07/21/15  Chase PicketJaime Pilcher Denina Rieger, PA-C  vitamin B-12 (CYANOCOBALAMIN) 100 MCG tablet Take 100 mcg by mouth daily.    Historical Provider, MD   BP 141/98 mmHg  Pulse 90  Temp(Src) 98.1 F (36.7 C) (Oral)  Resp 16  SpO2 91% Physical Exam  Constitutional: He is oriented to person, place, and time. He appears well-developed and well-nourished.  Alert and in no acute distress  HENT:  Head: Normocephalic and atraumatic.  Cardiovascular: Normal rate,  regular rhythm, normal heart sounds and intact distal pulses.  Exam reveals no gallop and no friction rub.   No murmur heard. Pulmonary/Chest: Effort normal and breath sounds normal. No respiratory distress. He has no wheezes. He has no rales.  Abdominal: He exhibits no mass. There is no rebound and no guarding.  Abdomen soft, non-tender, non-distended  Musculoskeletal: He exhibits no edema.  Lymphadenopathy:    He has no  cervical adenopathy.  Neurological: He is alert and oriented to person, place, and time.  Skin: Skin is warm and dry.  5mm abrasion c/w insect bite overlying 2x2 fluctuant tender nodule with large area of surrounding erythema  Psychiatric: He has a normal mood and affect. His behavior is normal. Judgment and thought content normal.  Nursing note and vitals reviewed.   ED Course  Procedures   INCISION AND DRAINAGE Performed by: Chase Picket Grecia Lynk Consent: Verbal consent obtained. Risks and benefits: risks, benefits and alternatives were discussed Type: abscess  Body area: right upper extremity   Anesthesia: local infiltration  Incision was made with a scalpel.  Local anesthetic: lidocaine 2% with epinephrine  Anesthetic total: 5 ml  Complexity: complex Blunt dissection to break up loculations  Drainage: seropurulent  Drainage amount: small  Patient tolerance: Patient tolerated the procedure well with no immediate complications.    DIAGNOSTIC STUDIES: Oxygen Saturation is 91% on RA, low by my interpretation.    COORDINATION OF CARE: 2:02 PM Will order pain medication and perform I&D. Will prescribe antibiotics. Advised pt to return if symptoms do not not improve. Discussed treatment plan with pt at bedside and pt agreed to plan.  Labs Review Labs Reviewed - No data to display  Imaging Review No results found. I have personally reviewed and evaluated these images and lab results as part of my medical decision-making.   EKG Interpretation None      MDM   Final diagnoses:  Cellulitis of right upper extremity   Devin Lodge presents with possible insect bite with purulent drainage - new onset erythema surrounding area.   Therapeutics: Pain control while in ED; IM Rocephin here.   A&P: Cellulitis   - Bactrim to cover for MRSA, Keflex  - Line drawn around cellulitis  - Return precautions given, including if erythema spreads past line drawn  - Wound  check in 2 days, earlier if worsening  I personally performed the services described in this documentation, which was scribed in my presence. The recorded information has been reviewed and is accurate.  [employer] Simran Mannis, PA-C 07/14/15 1527  Alvira Monday, MD 07/14/15 [phone]

## 2015-07-14 NOTE — Discharge Instructions (Signed)
Follow up for wound check in 48 hours! This is very important! Return earlier if redness is getting worse despite antibiotic use. Please take all of your antibiotics until finished!  Return to the emergency department if you have a fever that persists greater than 101, redness getting worse, any new or worsening symptoms, any additional concerns.

## 2015-07-14 NOTE — ED Notes (Signed)
PT DISCHARGED. INSTRUCTIONS AND PRESCRIPTIONS GIVEN. AAOX3. PT IN NO APPARENT DISTRESS. THE OPPORTUNITY TO ASK QUESTIONS WAS PROVIDED. 

## 2015-07-14 NOTE — ED Notes (Signed)
Pt c/o hot, erythematous, edematous lesion to right forearm onset 07/12/15 after feeling insect-bite-like sensation while working outside. Did not see what bit him.

## 2015-08-13 ENCOUNTER — Emergency Department (HOSPITAL_COMMUNITY): Payer: Self-pay

## 2015-08-13 ENCOUNTER — Emergency Department (HOSPITAL_COMMUNITY)
Admission: EM | Admit: 2015-08-13 | Discharge: 2015-08-13 | Disposition: A | Discharge status: Home / Self Care | Payer: Self-pay | Attending: Emergency Medicine | Admitting: Emergency Medicine

## 2015-08-13 ENCOUNTER — Encounter (HOSPITAL_COMMUNITY): Payer: Self-pay | Admitting: Emergency Medicine

## 2015-08-13 DIAGNOSIS — Z880 Allergy status to penicillin: Secondary | ICD-10-CM | POA: Insufficient documentation

## 2015-08-13 DIAGNOSIS — S61422A Laceration with foreign body of left hand, initial encounter: Secondary | ICD-10-CM | POA: Insufficient documentation

## 2015-08-13 DIAGNOSIS — S60552A Superficial foreign body of left hand, initial encounter: Secondary | ICD-10-CM

## 2015-08-13 DIAGNOSIS — Z79899 Other long term (current) drug therapy: Secondary | ICD-10-CM | POA: Insufficient documentation

## 2015-08-13 DIAGNOSIS — Y998 Other external cause status: Secondary | ICD-10-CM | POA: Insufficient documentation

## 2015-08-13 DIAGNOSIS — Y92009 Unspecified place in unspecified non-institutional (private) residence as the place of occurrence of the external cause: Secondary | ICD-10-CM | POA: Insufficient documentation

## 2015-08-13 DIAGNOSIS — Y9389 Activity, other specified: Secondary | ICD-10-CM | POA: Insufficient documentation

## 2015-08-13 DIAGNOSIS — J45909 Unspecified asthma, uncomplicated: Secondary | ICD-10-CM | POA: Insufficient documentation

## 2015-08-13 DIAGNOSIS — W458XXA Other foreign body or object entering through skin, initial encounter: Secondary | ICD-10-CM | POA: Insufficient documentation

## 2015-08-13 DIAGNOSIS — F17210 Nicotine dependence, cigarettes, uncomplicated: Secondary | ICD-10-CM | POA: Insufficient documentation

## 2015-08-13 MED ORDER — CEPHALEXIN 500 MG PO CAPS: 500.0000 mg | ORAL_CAPSULE | Freq: Four times a day (QID) | ORAL | Status: DC

## 2015-08-13 MED ORDER — IBUPROFEN 800 MG PO TABS
800.0000 mg | ORAL_TABLET | Freq: Once | ORAL | Status: AC
  Administered 2015-08-13: 800 mg via ORAL
  Filled 2015-08-13: qty 1

## 2015-08-13 MED ORDER — ACETAMINOPHEN 500 MG PO TABS
1000.0000 mg | ORAL_TABLET | Freq: Once | ORAL | Status: AC
  Administered 2015-08-13: 1000 mg via ORAL
  Filled 2015-08-13: qty 2

## 2015-08-13 MED ORDER — OXYCODONE HCL 5 MG PO TABS
5.0000 mg | ORAL_TABLET | Freq: Once | ORAL | Status: AC
  Administered 2015-08-13: 5 mg via ORAL
  Filled 2015-08-13: qty 1

## 2015-08-13 MED ORDER — LIDOCAINE-EPINEPHRINE 1 %-1:100000 IJ SOLN
20.0000 mL | Freq: Once | INTRAMUSCULAR | Status: AC
  Administered 2015-08-13: 1 mL via INTRADERMAL
  Filled 2015-08-13: qty 1

## 2015-08-13 MED ORDER — OXYCODONE HCL 5 MG PO TABS: 5.0000 mg | ORAL_TABLET | ORAL | Status: DC | PRN

## 2015-08-13 NOTE — Discharge Instructions (Signed)
Sliver Removal, Care After A sliver--also called a splinter--is a small and thin broken piece of an object that gets stuck (embedded) under the skin. A sliver can create a deep wound that can easily become infected. It is important to care for the wound after a sliver is removed to help prevent infection and other problems from developing. WHAT TO EXPECT AFTER THE PROCEDURE Slivers often break into smaller pieces when they are removed. If pieces of your sliver broke off and stayed in your skin, you will eventually see them working themselves out and you may feel some pain at the wound site. This is normal. HOME CARE INSTRUCTIONS  Keep all follow-up visits as directed by your health care provider. This is important.  There are many different ways to close and cover a wound, including stitches (sutures) and adhesive strips. Follow your health care provider's instructions about:  Wound care.  Bandage (dressing) changes and removal.  Wound closure removal.  Check the wound site every day for signs of infection. Watch for:  Red streaks coming from the wound.  Fever.  Redness or tenderness around the wound.  Fluid, blood, or pus coming from the wound.  A bad smell coming from the wound. SEEK MEDICAL CARE IF:  You think that a piece of the sliver is still in your skin.  Your wound was closed, as with sutures, and the edges of the wound break open.  You have signs of infection, including:  New or worsening redness around the wound.  New or worsening tenderness around the wound.  Fluid, blood, or pus coming from the wound.  A bad smell coming from the wound or dressing. SEEK IMMEDIATE MEDICAL CARE IF: You have any of the following signs of infection:  Red streaks coming from the wound.  An unexplained fever.   This information is not intended to replace advice given to you by your health care provider. Make sure you discuss any questions you have with your health care  provider.   Document Released: 06/25/2000 Document Revised: 07/19/2014 Document Reviewed: 02/28/2014 Elsevier Interactive Patient Education 2016 [employer]  

## 2015-08-13 NOTE — ED Notes (Signed)
PT TRANSPORTED TO XRAY 

## 2015-08-13 NOTE — ED Notes (Signed)
Pt from home with a splinter through the meat of his left hand between his thumb and pointer finger. There is a point of entry and an exit point. Bleeding is controlled. Pt states his last tetanus shot was about 7 years ago. Pt is able to move all fingers

## 2015-08-13 NOTE — ED Provider Notes (Signed)
CSN: [id number]     Arrival date & time 08/13/15  2030 History   First MD Initiated Contact with Patient 08/13/15 2059     Chief Complaint  Patient presents with  . Hand Injury    splinter through hand      (Consider location/radiation/quality/duration/timing/severity/associated sxs/prior Treatment) Patient is a 50 y.o. male presenting with hand injury. The history is provided by the patient.  Hand Injury Location:  Hand Time since incident:  1 hour Injury: yes   Mechanism of injury comment:  Splinter Hand location:  L hand Pain details:    Quality:  Aching   Radiates to:  Does not radiate   Severity:  Moderate   Onset quality:  Sudden   Duration:  2 days   Timing:  Constant   Progression:  Worsening Chronicity:  New Handedness:  Right-handed Dislocation: no   Foreign body present:  Wood Tetanus status:  Up to date Prior injury to area:  No Relieved by:  Nothing Worsened by:  Nothing tried Ineffective treatments:  None tried Associated symptoms: no fever     50 yo M  With a chief complaint of a splinter in his hand. Patient states that this happened when he was working on something at home. Patient slid his hand around the piece of what going through it. He tried to pull it out but it broke off of the ends.  Patient has a burning sharp pain in the area. Denies radiation. Happened just prior to arrival. Pain is been persistent.  Past Medical History  Diagnosis Date  . Asthma    History reviewed. No pertinent past surgical history. No family history on file. Social History  Substance Use Topics  . Smoking status: Current Every Day Smoker -- 0.50 packs/day    Types: Cigarettes  . Smokeless tobacco: None  . Alcohol Use: Yes    Review of Systems  Constitutional: Negative for fever and chills.  HENT: Negative for congestion and facial swelling.   Eyes: Negative for discharge and visual disturbance.  Respiratory: Negative for shortness of breath.   Cardiovascular:  Negative for chest pain and palpitations.  Gastrointestinal: Negative for vomiting, abdominal pain and diarrhea.  Musculoskeletal: Negative for myalgias and arthralgias.  Skin: Positive for wound. Negative for color change and rash.  Neurological: Negative for tremors, syncope and headaches.  Psychiatric/Behavioral: Negative for confusion and dysphoric mood.      Allergies  Penicillins  Home Medications   Prior to Admission medications   Medication Sig Start Date End Date Taking? Authorizing Provider  albuterol-ipratropium (COMBIVENT) 18-103 MCG/ACT inhaler Inhale 2 puffs into the lungs every 4 (four) hours as needed for wheezing or shortness of breath. Reported on 08/13/2015   Yes Historical Provider, MD  HYDROcodone-acetaminophen (NORCO/VICODIN) 5-325 MG tablet Take 2 tablets by mouth every 4 (four) hours as needed. Patient taking differently: Take 2 tablets by mouth every 4 (four) hours as needed for moderate pain.  07/14/15  Yes Jaime Pilcher Ward, PA-C  ibuprofen (ADVIL,MOTRIN) 200 MG tablet Take 400 mg by mouth every 6 (six) hours as needed for moderate pain.   Yes Historical Provider, MD  Multiple Vitamin (MULTIVITAMIN WITH MINERALS) TABS tablet Take 1 tablet by mouth daily.   Yes Historical Provider, MD  vitamin B-12 (CYANOCOBALAMIN) 100 MCG tablet Take 100 mcg by mouth daily.   Yes Historical Provider, MD  cephALEXin (KEFLEX) 500 MG capsule Take 1 capsule (500 mg total) by mouth 4 (four) times daily. Patient not taking: Reported on  08/13/2015 07/14/15   Chase Picket Ward, PA-C  cephALEXin (KEFLEX) 500 MG capsule Take 1 capsule (500 mg total) by mouth 4 (four) times daily. 08/13/15   Melene Plan, DO  naproxen (NAPROSYN) 500 MG tablet Take 1 tablet (500 mg total) by mouth 2 (two) times daily. Patient not taking: Reported on 08/13/2015 03/20/15   Linwood Dibbles, MD  oxyCODONE (ROXICODONE) 5 MG immediate release tablet Take 1 tablet (5 mg total) by mouth every 4 (four) hours as needed for severe pain.  08/13/15   Melene Plan, DO  oxyCODONE-acetaminophen (PERCOCET) 5-325 MG per tablet Take 1 tablet by mouth every 6 (six) hours as needed for moderate pain. Patient not taking: Reported on 08/13/2015 03/20/15   Linwood Dibbles, MD   BP 151/98 mmHg  Pulse 103  Temp(Src) 98.3 F (36.8 C) (Oral)  Resp 16  Wt 175 lb (79.379 kg)  SpO2 97% Physical Exam  Constitutional: He is oriented to person, place, and time. He appears well-developed and well-nourished.  HENT:  Head: Normocephalic and atraumatic.  Eyes: EOM are normal. Pupils are equal, round, and reactive to light.  Neck: Normal range of motion. Neck supple. No JVD present.  Cardiovascular: Normal rate and regular rhythm.  Exam reveals no gallop and no friction rub.   No murmur heard. Pulmonary/Chest: No respiratory distress. He has no wheezes.  Abdominal: He exhibits no distension. There is no tenderness. There is no rebound and no guarding.  Musculoskeletal: Normal range of motion. He exhibits tenderness. He exhibits no edema.   Splinter through the palmar aspect of the left hand ending in the proximal phalanx of the first digit.  Neurological: He is alert and oriented to person, place, and time.  Skin: No rash noted. No pallor.  Psychiatric: He has a normal mood and affect. His behavior is normal.  Nursing note and vitals reviewed.   ED Course  .Foreign Body Removal Date/Time: 08/13/2015 10:35 PM Performed by: Adela Lank Estelle Skibicki Authorized by: Melene Plan Consent: Verbal consent obtained. Risks and benefits: risks, benefits and alternatives were discussed Consent given by: patient Required items: required blood products, implants, devices, and special equipment available Patient identity confirmed: verbally with patient Body area: skin General location: upper extremity Location details: left hand Anesthesia: local infiltration and digital block Local anesthetic: lidocaine 1% with epinephrine Anesthetic total: 15 ml Patient sedated: no Patient  restrained: no Patient cooperative: yes Localization method: probed and visualized Removal mechanism: forceps and hemostat Dressing: antibiotic ointment and dressing applied Tendon involvement: none Depth: deep Complexity: complex 2 objects recovered. Objects recovered: wood splinter Post-procedure assessment: foreign body removed Patient tolerance: Patient tolerated the procedure well with no immediate complications .Marland KitchenLaceration Repair Date/Time: 08/13/2015 10:36 PM Performed by: Adela Lank Pauline Trainer Authorized by: Melene Plan Consent: Verbal consent obtained. Risks and benefits: risks, benefits and alternatives were discussed Consent given by: patient Required items: required blood products, implants, devices, and special equipment available Patient identity confirmed: verbally with patient Time out: Immediately prior to procedure a "time out" was called to verify the correct patient, procedure, equipment, support staff and site/side marked as required. Body area: upper extremity Location details: left hand Laceration length: 10 cm Foreign bodies: wood Tendon involvement: none Nerve involvement: none Vascular damage: no Anesthesia: local infiltration Local anesthetic: lidocaine 1% with epinephrine Anesthetic total: 15 ml Patient sedated: no Preparation: Patient was prepped and draped in the usual sterile fashion. Irrigation solution: saline and tap water Irrigation method: syringe Amount of cleaning: extensive Debridement: extensive Degree of undermining: none Skin  closure: 4-0 nylon Number of sutures: 2 Technique: simple Approximation: loose Approximation difficulty: simple Patient tolerance: Patient tolerated the procedure well with no immediate complications   (including critical care time) Labs Review Labs Reviewed - No data to display  Imaging Review Dg Hand Complete Left  08/13/2015  CLINICAL DATA:  Wooden splinter in the left hand. EXAM: LEFT HAND - COMPLETE 3+ VIEW  COMPARISON:  None. FINDINGS: No visible opaque foreign body or soft tissue gas. Note that a wooden foreign body may be radiographically occult. No acute fracture or malalignment. Sessile excrescence from the radial base of the fourth metacarpal favoring osteochondroma. Bony excrescence from the tuft is likely second osteochondroma (versus remote trauma). IMPRESSION: No acute finding, as above. Electronically Signed   By: Marnee Spring M.D.   On: 08/13/2015 21:27   I have personally reviewed and evaluated these images and lab results as part of my medical decision-making.   EKG Interpretation None      MDM   Final diagnoses:  Foreign body in hand, left, initial encounter    50 yo M  With a foreign body in the left hand. Left hand was explored and the foreign body was removed in 2 pieces. This was loosely sutured together. Will start on prophylactic antibiotics. PCP follow-up 10 days for suture removal.  10:34 PM:  I have discussed the diagnosis/risks/treatment options with the patient and believe the pt to be eligible for discharge home to follow-up with PCP. We also discussed returning to the ED immediately if new or worsening sx occur. We discussed the sx which are most concerning (e.g., sudden worsening pain, fever, spreading redness, purulent drainage) that necessitate immediate return. Medications administered to the patient during their visit and any new prescriptions provided to the patient are listed below.  Medications given during this visit Medications  lidocaine-EPINEPHrine (XYLOCAINE W/EPI) 1 %-1:100000 (with pres) injection 20 mL (1 mL Intradermal Given 08/13/15 2117)  ibuprofen (ADVIL,MOTRIN) tablet 800 mg (800 mg Oral Given 08/13/15 2117)  acetaminophen (TYLENOL) tablet 1,000 mg (1,000 mg Oral Given 08/13/15 2118)  oxyCODONE (Oxy IR/ROXICODONE) immediate release tablet 5 mg (5 mg Oral Given 08/13/15 2118)    New Prescriptions   CEPHALEXIN (KEFLEX) 500 MG CAPSULE    Take 1 capsule  (500 mg total) by mouth 4 (four) times daily.   OXYCODONE (ROXICODONE) 5 MG IMMEDIATE RELEASE TABLET    Take 1 tablet (5 mg total) by mouth every 4 (four) hours as needed for severe pain.    The patient appears reasonably screen and/or stabilized for discharge and I doubt any other medical condition or other [facility] requiring further screening, evaluation, or treatment in the ED at this time prior to discharge.      Melene Plan, DO 08/13/15 2236

## 2015-09-16 ENCOUNTER — Emergency Department (HOSPITAL_COMMUNITY)
Admission: EM | Admit: 2015-09-16 | Discharge: 2015-09-16 | Disposition: A | Discharge status: Home / Self Care | Payer: No Typology Code available for payment source | Attending: Emergency Medicine | Admitting: Emergency Medicine

## 2015-09-16 ENCOUNTER — Encounter (HOSPITAL_COMMUNITY): Payer: Self-pay | Admitting: Emergency Medicine

## 2015-09-16 DIAGNOSIS — F10230 Alcohol dependence with withdrawal, uncomplicated: Secondary | ICD-10-CM | POA: Insufficient documentation

## 2015-09-16 DIAGNOSIS — F10930 Alcohol use, unspecified with withdrawal, uncomplicated: Secondary | ICD-10-CM

## 2015-09-16 DIAGNOSIS — J45909 Unspecified asthma, uncomplicated: Secondary | ICD-10-CM | POA: Insufficient documentation

## 2015-09-16 DIAGNOSIS — F17210 Nicotine dependence, cigarettes, uncomplicated: Secondary | ICD-10-CM | POA: Insufficient documentation

## 2015-09-16 DIAGNOSIS — Z79899 Other long term (current) drug therapy: Secondary | ICD-10-CM | POA: Insufficient documentation

## 2015-09-16 DIAGNOSIS — F1110 Opioid abuse, uncomplicated: Secondary | ICD-10-CM | POA: Insufficient documentation

## 2015-09-16 DIAGNOSIS — F1410 Cocaine abuse, uncomplicated: Secondary | ICD-10-CM | POA: Insufficient documentation

## 2015-09-16 DIAGNOSIS — F419 Anxiety disorder, unspecified: Secondary | ICD-10-CM | POA: Insufficient documentation

## 2015-09-16 DIAGNOSIS — Z880 Allergy status to penicillin: Secondary | ICD-10-CM | POA: Insufficient documentation

## 2015-09-16 LAB — CBC
HCT: 37.6 % — ABNORMAL LOW (ref 39.0–52.0)
Hemoglobin: 12.1 g/dL — ABNORMAL LOW (ref 13.0–17.0)
MCH: 20.9 pg — AB (ref 26.0–34.0)
MCHC: 32.2 g/dL (ref 30.0–36.0)
MCV: 65.1 fL — AB (ref 78.0–100.0)
PLATELETS: 267 10*3/uL (ref 150–400)
RBC: 5.78 MIL/uL (ref 4.22–5.81)
RDW: 16.3 % — AB (ref 11.5–15.5)
WBC: 9.7 10*3/uL (ref 4.0–10.5)

## 2015-09-16 LAB — COMPREHENSIVE METABOLIC PANEL
ALK PHOS: 80 U/L (ref 38–126)
ALT: 51 U/L (ref 17–63)
AST: 29 U/L (ref 15–41)
Albumin: 4.2 g/dL (ref 3.5–5.0)
Anion gap: 12 (ref 5–15)
BUN: 16 mg/dL (ref 6–20)
CALCIUM: 9.5 mg/dL (ref 8.9–10.3)
CO2: 28 mmol/L (ref 22–32)
CREATININE: 1.1 mg/dL (ref 0.61–1.24)
Chloride: 106 mmol/L (ref 101–111)
GFR calc non Af Amer: >60 mL/min (ref >60)
Glucose, Bld: 92 mg/dL (ref 65–99)
Potassium: 4.3 mmol/L (ref 3.5–5.1)
SODIUM: 146 mmol/L — AB (ref 135–145)
Total Bilirubin: 0.5 mg/dL (ref 0.3–1.2)
Total Protein: 7.5 g/dL (ref 6.5–8.1)

## 2015-09-16 LAB — RAPID URINE DRUG SCREEN, HOSP PERFORMED
Amphetamines: NOT DETECTED
BENZODIAZEPINES: NOT DETECTED
Barbiturates: NOT DETECTED
Cocaine: POSITIVE — AB
Opiates: POSITIVE — AB
Tetrahydrocannabinol: NOT DETECTED

## 2015-09-16 LAB — ETHANOL: Alcohol, Ethyl (B): <5 mg/dL (ref <5)

## 2015-09-16 MED ORDER — LORAZEPAM 1 MG PO TABS
2.0000 mg | ORAL_TABLET | Freq: Once | ORAL | Status: AC
  Administered 2015-09-16: 2 mg via ORAL
  Filled 2015-09-16: qty 2

## 2015-09-16 MED ORDER — CHLORDIAZEPOXIDE HCL 25 MG PO CAPS: ORAL_CAPSULE | ORAL | Status: DC

## 2015-09-16 MED ORDER — ONDANSETRON 8 MG PO TBDP: 8.0000 mg | ORAL_TABLET | Freq: Three times a day (TID) | ORAL | Status: AC | PRN

## 2015-09-16 NOTE — ED Notes (Addendum)
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 Pt denies thoughts of hurting himself or others. Pt denies AVH. Pt states he has been using every day for 3 years. Pt is alert and orient x 4. Pt [facility]. Pt is calm and cooperative in triage  Pt states that he was living in a halfway house, but never quit using heroin/etoh. Pt states he got kicked out of the halfway house for not being able to pass a drug test

## 2015-09-16 NOTE — ED Provider Notes (Addendum)
CSN: [id number]     Arrival date & time 09/16/15  1609 History   First MD Initiated Contact with Patient 09/16/15 1821     Chief Complaint  Patient presents with  . Addiction Problem    heroin and etoh  . 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 Last alcohol use was 1 PM today. Has had some tremors but denies any seizure activity. No abdominal pain or vomiting. Denies any suicidal or homicidal ideations. No auditory of visual hallucinations. Was living in a halfway house and recently started to abuse substances again. Is requesting help at this time  The history is provided by the patient.    Past Medical History  Diagnosis Date  . Asthma    Past Surgical History  Procedure Laterality Date  . Hand surgery     No family history on file. Social History  Substance Use Topics  . Smoking status: Current Every Day Smoker -- 0.50 packs/day    Types: Cigarettes  . Smokeless tobacco: None  . Alcohol Use: Yes    Review of Systems  All other systems reviewed and are negative.     Allergies  Penicillins  Home Medications   Prior to Admission medications   Medication Sig Start Date End Date Taking? Authorizing Provider  albuterol-ipratropium (COMBIVENT) 18-103 MCG/ACT inhaler Inhale 2 puffs into the lungs every 4 (four) hours as needed for wheezing or shortness of breath. Reported on 08/13/2015   Yes Historical Provider, MD  ibuprofen (ADVIL,MOTRIN) 200 MG tablet Take 400 mg by mouth every 6 (six) hours as needed for moderate pain.   Yes Historical Provider, MD  Multiple Vitamin (MULTIVITAMIN WITH MINERALS) TABS tablet Take 1 tablet by mouth daily.   Yes Historical Provider, MD  vitamin B-12 (CYANOCOBALAMIN) 100 MCG tablet Take 100 mcg by mouth daily.   Yes Historical Provider, MD  cephALEXin (KEFLEX) 500 MG capsule Take 1  capsule (500 mg total) by mouth 4 (four) times daily. Patient not taking: Reported on 08/13/2015 07/14/15   [facility] Ward, PA-C  cephALEXin (KEFLEX) 500 MG capsule Take 1 capsule (500 mg total) by mouth 4 (four) times daily. Patient not taking: Reported on 09/16/2015 08/13/15   Melene Plan, DO  HYDROcodone-acetaminophen (NORCO/VICODIN) 5-325 MG tablet Take 2 tablets by mouth every 4 (four) hours as needed. Patient not taking: Reported on 09/16/2015 07/14/15   [facility] Ward, PA-C  naproxen (NAPROSYN) 500 MG tablet Take 1 tablet (500 mg total) by mouth 2 (two) times daily. Patient not taking: Reported on 08/13/2015 03/20/15   Linwood Dibbles, MD  oxyCODONE (ROXICODONE) 5 MG immediate release tablet Take 1 tablet (5 mg total) by mouth every 4 (four) hours as needed for severe pain. Patient not taking: Reported on 09/16/2015 08/13/15   Melene Plan, DO  oxyCODONE-acetaminophen (PERCOCET) 5-325 MG per tablet Take 1 tablet by mouth every 6 (six) hours as needed for moderate pain. Patient not taking: Reported on 08/13/2015 03/20/15   Linwood Dibbles, MD   BP 132/85 mmHg  Pulse 97  Temp(Src) 98 F (36.7 C) (Oral)  Resp 16  Ht  (1.778 m)  Wt 79.379 kg  BMI 25.11 kg/m2  SpO2 98% Physical Exam  Constitutional: He is oriented to person, place, and time. He appears well-developed and well-nourished.  Non-toxic appearance. No distress.  HENT:  Head: Normocephalic and atraumatic.  Eyes: Conjunctivae, EOM and lids are normal. Pupils are equal,  round, and reactive to light.  Neck: Normal range of motion. Neck supple. No tracheal deviation present. No thyroid mass present.  Cardiovascular: Normal rate, regular rhythm and normal heart sounds.  Exam reveals no gallop.   No murmur heard. Pulmonary/Chest: Effort normal and breath sounds normal. No stridor. No respiratory distress. He has no decreased breath sounds. He has no wheezes. He has no rhonchi. He has no rales.  Abdominal: Soft. Normal appearance and bowel sounds are  normal. He exhibits no distension. There is no tenderness. There is no rebound and no CVA tenderness.  Musculoskeletal: Normal range of motion. He exhibits no edema or tenderness.  Neurological: He is alert and oriented to person, place, and time. He has normal strength. No cranial nerve deficit or sensory deficit. GCS eye subscore is 4. GCS verbal subscore is 5. GCS motor subscore is 6.  Skin: Skin is warm and dry. No abrasion and no rash noted.  Psychiatric: His speech is normal. His affect is blunt. He is slowed.  Nursing note and vitals reviewed.   ED Course  Procedures (including critical care time) Labs Review Labs Reviewed  COMPREHENSIVE METABOLIC PANEL  ETHANOL  CBC  URINE RAPID DRUG SCREEN, HOSP PERFORMED    Imaging Review No results found. I have personally reviewed and evaluated these images and lab results as part of my medical decision-making.   EKG Interpretation None      MDM   Final diagnoses:  None    Patient was given Ativan here. Will check his CIWA score. And likely discharge with outpatient follow-up  8:24 PM Patient rechecked prior to discharge she is not tachycardic. His shakes has greatly improved. Will be placed on a Librium taper. Patient's initial CIWA score noted and patient appears to be more anxious than going through any signs of DTs at this time. Will give additional dose of Ativan prior to discharge  Lorre NickAnthony Nyheim Seufert, MD 09/16/15 1835  Lorre NickAnthony Breanne Olvera, MD 09/16/15 2024

## 2015-09-16 NOTE — Discharge Instructions (Signed)
Alcohol Withdrawal Alcohol withdrawal is a group of symptoms that can develop when a person who drinks heavily and regularly stops drinking or drinks less. CAUSES Heavy and regular drinking can cause chemicals that send signals from the brain to the body (neurotransmitters) to deactivate. Alcohol withdrawal develops when deactivated neurotransmitters reactivate because a person stops drinking or drinks less. RISK FACTORS The more a person drinks and the longer he or she drinks, the greater the risk of alcohol withdrawal. Severe withdrawal is more likely to develop in someone who:  Had severe alcohol withdrawal in the past.  Had a seizure during a previous episode of alcohol withdrawal.  Is elderly.  Is pregnant.  Has been abusing drugs.  Has other medical problems, including:  Infection.  Heart, lung, or liver disease.  Seizures.  Mental health problems. SYMPTOMS Symptoms of this condition can be mild to moderate, or they can be severe. Mild to moderate symptoms may include:  Fatigue.  Nightmares.  Trouble sleeping.  Depression.  Anxiety.  Inability to think clearly.  Mood swings.  Irritability.  Loss of appetite.  Nausea or vomiting.  Clammy skin.  Extreme sweating.  Rapid heartbeat.  Shakiness.  Uncontrollable shaking (tremor). Severe symptoms may include:  Fever.  Seizures.  Severeconfusion.  Feeling or seeing things that are not there (hallucinations). Symptoms usually begin within eight hours after a person stops drinking or drinks less. They can last for weeks. DIAGNOSIS Alcohol withdrawal is diagnosed with a medical history and physical exam. Sometimes, urine and blood tests are also done. TREATMENT Treatment may involve:  Monitoring blood pressure, pulse, and breathing.  Getting fluids through an IV tube.  Medicine to reduce anxiety.  Medicine to prevent or control seizures.  Multivitamins and B vitamins.  Having a health  care provider check on you daily. If symptoms are moderate to severe or if there is a risk of severe withdrawal, treatment may be done at a hospital or treatment center. HOME CARE INSTRUCTIONS  Take medicines and vitamin supplements only as directed by your health care provider.  Do not drink alcohol.  Have someone stay with you or be available if you need help.  Drink enough fluid to keep your urine clear or pale yellow.  Consider joining a 12-step program or another alcohol support group. SEEK MEDICAL CARE IF:  Your symptoms get worse or do not go away.  You cannot keep food or water in your stomach.  You are struggling with not drinking alcohol.  You cannot stop drinking alcohol. SEEK IMMEDIATE MEDICAL CARE IF:   You have an irregular heartbeat.  You have chest pain.  You have trouble breathing.  You have symptoms of severe withdrawal, such as:  A fever.  Seizures.  Severe confusion.  Hallucinations.   This information is not intended to replace advice given to you by your health care provider. Make sure you discuss any questions you have with your health care provider.   Document Released: 04/07/2005 Document Revised: 07/19/2014 Document Reviewed: 04/16/2014 Elsevier Interactive Patient Education 2016 [employer]. [employer] Guide Outpatient Counseling/Substance Abuse Adolescent The United Ways 211 is a great source of information about community services available.  Access by dialing 2-1-1 from anywhere in [place], or by website -  PooledIncome.pl.   Other Local Resources (Updated 07/2015)  Crisis Hotlines   Services     Area Served  [employer]  Crisis Hotline, available 24 hours a day, 7 days a week: 506-442-0027 [facility], [place]  [facility]  Crisis Hotline, available 24 hours a day, 7 days a week: [phone] [facility] [place], [place]  [facility]  Suicide Prevention Hotline, available 24  hours a day, 7 days a week: 270-425-9767 [facility], [place]  [facility]   Crisis Hotline, available 24 hours a day, 7 days a week: 8701997038872 [phone] [facility], [place]   [facility] Access to [facility]  Crisis Hotline, available 24 hours a day, 7 days a week: ([phone] All   [facility]  Crisis Hotline, available 24 hours a day, 7 days a week: 253-602-1447(410)695-0983 All   Other Local Resources (Updated 07/2015)  Outpatient Counseling/ Substance Abuse Programs  Services     Address and Phone Number  [facility]  Offers individual counseling [phone] [address], [address] [place], [place] [address]  [facility]  Offers individual counseling  Accepts Medicare, private pay, and private insurance [phone] [address], [address] [place], [place] [address]  [facility]  Provides individual counseling, substance abuse intensive outpatient program (several hours a day, several days a week), day treatment program, and school-based therapy  Delene Lollccepts Medicare, Medicaid, private insurance [phone] [address], [address] [place], [place] [address]  [facility]  Offers individual counseling, family counseling, group therapy, substance abuse intensive outpatient program (several hours a day, several days a week), and a partial hospitalization program [phone] [address] [place], [place] [address]  [phone] [address] [place], [place] [address]  [phone] [address] [place], [place] [address]  [phone] [place] [address] [place], [place] [address]  [facility]  Offers individual and family counseling  Accepts Medicaid and private insurance  Offers a sliding scale for uninsured [phone] [address], [address] [place], [place] [address]  [facility]  Accepts private insurance  [phone] [address], [address] [place], [place] [address]  [facility]  Offers individual counseling and intensive in-home services [phone] [address], [address] [place], [place] [address]  [facility]  Offers individual counseling, family counseling, group therapy, domestic violence counseling  Accepts Medicaid and private insurance  Offers sliding scale for uninsured [phone] [address] [place], [place] [address]  [phone] [facility], [address] [place], [place] [address]  [facility]  Offers individual counseling, family counseling group therapy, and school-based therapy  3 locations - [place], [place], and [place] [phone]  [address] [place], [place] [address]  [address] [place], [place] [address]  [address] [place], [place] [address]  [facility]  Offers individual and family counseling  Accepts [place] and private insurance  Offers sliding scale for uninsured [phone] [address] [place], [place] [address]  Len Blalockavid Fuller, MD  Accepts private insurance [phone] [address] [place], [place] [address]  [facility]   Offers outpatient substance abuse counseling, intensive outpatient substance abuse programs (several hours a day, several days a week), and residential substance abuse treatment  [phone], or [phone] [address], [address] [place], [place]  [facility]  Accepts private insurance [phone] [address] [place], [place] [address]  [facility]  Accepts Medicare and private insurance [phone] [address] [place], [place] [address]  [facility]    Offers intensive outpatient program (several hours a day, several days a week)  Accepts private pay and private insurance [phone] [facility] [address]  [place], [place]  [facility]    Offers intensive outpatient program (several hours a day, several days a week), and partial hospitalization program [phone] [address] [place], [place] [address]  [facility]  Offers individual and family counseling  Accepts private insurance  Offers sliding scale for uninsured [phone] [address] [place], [place] [address]  [facility]  [place] [facility] [phone] [address], [address] [place], [place] [address]  [facility]  Offers individual and family counseling  Offers LGBTQ services  Accepts private insurance and private pay [phone] [address] [place], [place] [address]  [facility]  Offers individual and family counseling  Accepts private insurance [phone] [address], [address] [place], [place] [address]  [facility]   Adolescent Substance Abuse Program (ASAP): [phone]  [employer] Structured Day Program: [phone] [place], [place]  [facility]  Serves children ages 93 - 17 and their families  Offers intensive in-home treatment and residential programs [phone] [address] [place], [place] [address]

## 2015-09-16 NOTE — ED Notes (Addendum)
Discharge instructions, follow up care, and rx x2 reviewed with patient. Patient verbalized understanding. Patient is alert, oriented, and ambulatory upon discharge.

## 2016-02-25 ENCOUNTER — Encounter (HOSPITAL_COMMUNITY): Payer: Self-pay

## 2016-02-25 ENCOUNTER — Emergency Department (HOSPITAL_COMMUNITY)
Admission: EM | Admit: 2016-02-25 | Discharge: 2016-02-25 | Disposition: A | Discharge status: Home / Self Care | Payer: Self-pay | Attending: Emergency Medicine | Admitting: Emergency Medicine

## 2016-02-25 DIAGNOSIS — Z008 Encounter for other general examination: Secondary | ICD-10-CM

## 2016-02-25 DIAGNOSIS — F17210 Nicotine dependence, cigarettes, uncomplicated: Secondary | ICD-10-CM | POA: Insufficient documentation

## 2016-02-25 DIAGNOSIS — J45909 Unspecified asthma, uncomplicated: Secondary | ICD-10-CM | POA: Insufficient documentation

## 2016-02-25 DIAGNOSIS — Z791 Long term (current) use of non-steroidal anti-inflammatories (NSAID): Secondary | ICD-10-CM | POA: Insufficient documentation

## 2016-02-25 DIAGNOSIS — F1410 Cocaine abuse, uncomplicated: Secondary | ICD-10-CM | POA: Insufficient documentation

## 2016-02-25 DIAGNOSIS — F1120 Opioid dependence, uncomplicated: Secondary | ICD-10-CM | POA: Insufficient documentation

## 2016-02-25 DIAGNOSIS — Z7982 Long term (current) use of aspirin: Secondary | ICD-10-CM | POA: Insufficient documentation

## 2016-02-25 HISTORY — DX: Cocaine abuse, uncomplicated: F14.10

## 2016-02-25 HISTORY — DX: Opioid abuse, uncomplicated: F11.10

## 2016-02-25 HISTORY — DX: Alcohol abuse, uncomplicated: F10.10

## 2016-02-25 LAB — COMPREHENSIVE METABOLIC PANEL
ALT: 375 U/L — ABNORMAL HIGH (ref 17–63)
ANION GAP: 6 (ref 5–15)
AST: 160 U/L — AB (ref 15–41)
Albumin: 4.4 g/dL (ref 3.5–5.0)
Alkaline Phosphatase: 90 U/L (ref 38–126)
BILIRUBIN TOTAL: 1 mg/dL (ref 0.3–1.2)
BUN: 9 mg/dL (ref 6–20)
CHLORIDE: 107 mmol/L (ref 101–111)
CO2: 27 mmol/L (ref 22–32)
Calcium: 9.3 mg/dL (ref 8.9–10.3)
Creatinine, Ser: 1.02 mg/dL (ref 0.61–1.24)
Glucose, Bld: 99 mg/dL (ref 65–99)
POTASSIUM: 4.3 mmol/L (ref 3.5–5.1)
Sodium: 140 mmol/L (ref 135–145)
TOTAL PROTEIN: 8 g/dL (ref 6.5–8.1)

## 2016-02-25 LAB — CBC WITH DIFFERENTIAL/PLATELET
BASOS PCT: 0 %
Basophils Absolute: 0 10*3/uL (ref 0.0–0.1)
EOS PCT: 0 %
Eosinophils Absolute: 0 10*3/uL (ref 0.0–0.7)
HEMATOCRIT: 37.7 % — AB (ref 39.0–52.0)
Hemoglobin: 12.2 g/dL — ABNORMAL LOW (ref 13.0–17.0)
LYMPHS ABS: 2.2 10*3/uL (ref 0.7–4.0)
Lymphocytes Relative: 21 %
MCH: 20.4 pg — AB (ref 26.0–34.0)
MCHC: 32.4 g/dL (ref 30.0–36.0)
MCV: 62.9 fL — AB (ref 78.0–100.0)
MONOS PCT: 11 %
Monocytes Absolute: 1.2 10*3/uL — ABNORMAL HIGH (ref 0.1–1.0)
NEUTROS ABS: 7.3 10*3/uL (ref 1.7–7.7)
Neutrophils Relative %: 68 %
Platelets: 249 10*3/uL (ref 150–400)
RBC: 5.99 MIL/uL — AB (ref 4.22–5.81)
RDW: 15.8 % — AB (ref 11.5–15.5)
WBC: 10.7 10*3/uL — AB (ref 4.0–10.5)

## 2016-02-25 LAB — RAPID URINE DRUG SCREEN, HOSP PERFORMED
AMPHETAMINES: NOT DETECTED
BENZODIAZEPINES: NOT DETECTED
Barbiturates: NOT DETECTED
COCAINE: POSITIVE — AB
OPIATES: POSITIVE — AB
TETRAHYDROCANNABINOL: NOT DETECTED

## 2016-02-25 LAB — ETHANOL

## 2016-02-25 LAB — SALICYLATE LEVEL: Salicylate Lvl: <4 mg/dL (ref 2.8–30.0)

## 2016-02-25 LAB — ACETAMINOPHEN LEVEL: Acetaminophen (Tylenol), Serum: <10 ug/mL — ABNORMAL LOW (ref 10–30)

## 2016-02-25 MED ORDER — PROMETHAZINE HCL 25 MG PO TABS: 25.0000 mg | ORAL_TABLET | Freq: Four times a day (QID) | ORAL | 0 refills | Status: AC | PRN

## 2016-02-25 MED ORDER — CLONIDINE HCL 0.1 MG PO TABS
0.1000 mg | ORAL_TABLET | Freq: Once | ORAL | Status: AC
  Administered 2016-02-25: 0.1 mg via ORAL
  Filled 2016-02-25: qty 1

## 2016-02-25 MED ORDER — CLONIDINE HCL 0.1 MG PO TABS: ORAL_TABLET | ORAL | 0 refills | Status: AC

## 2016-02-25 MED ORDER — PROMETHAZINE HCL 25 MG PO TABS
25.0000 mg | ORAL_TABLET | Freq: Once | ORAL | Status: AC
  Administered 2016-02-25: 25 mg via ORAL
  Filled 2016-02-25: qty 1

## 2016-02-25 NOTE — ED Notes (Addendum)
Per Dr. Donnald GarrePfeiffer pt records faxed to [facility] per patient request. Hard copy also given to patient per Pfeiffer EDP.

## 2016-02-25 NOTE — Progress Notes (Signed)
CM spoke with pt who confirms uninsured [place] resident with no pcp.  CM discussed and provided written information to assist pt with determining choice for uninsured accepting pcps, discussed the importance of pcp vs EDP services for f/u care, www.needymeds.org, www.goodrx.com, discounted pharmacies and other [place] resources such as [facility] , [facility], affordable care act, financial assistance, uninsured dental services, [place] med assist, DSS and  health department  Reviewed resources for [place] uninsured accepting pcps like [facility], [facility] [address], [facility], [facility], local urgent care centers, [facility], [facility], general medical clinics, [facility], [facility] plus others, medication resources, [facility] out patient pharmacies and housing Pt voiced understanding and appreciation of resources provided   Provided [facility] contact information Pt agreed to a referral Cm completed referral Pt to be contact by [facility] clinical liaison  

## 2016-02-25 NOTE — Discharge Instructions (Signed)
An HIV test and a hepatitis test have been obtained. These results will not be available today. You must schedule an appointment with a family doctor as soon as possible.

## 2016-02-25 NOTE — ED Provider Notes (Signed)
[facility]-EMERGENCY DEPT Provider Note   CSN: [id number] Arrival date & time: 02/25/16  1444     History   Chief Complaint Chief Complaint  Patient presents with  . Heroin Detox  . Cocaine Detox    HPI Devin Randall is a 50 y.o. male.  HPI Patient [facility] and requires medical clearance. He states he has been abusing heroin by injection and cocaine. He denies any history of alcohol abuse. He does report he is experiencing some heroin withdrawal symptoms of cramping abdominal discomfort with diarrhea and vomiting. He reports experiencing chills. Past Medical History:  Diagnosis Date  . Asthma   . Cocaine abuse   . ETOH abuse   . Heroin abuse     There are no active problems to display for this patient.   Past Surgical History:  Procedure Laterality Date  . HAND SURGERY         Home Medications    Prior to Admission medications   Medication Sig Start Date End Date Taking? Authorizing Provider  albuterol-ipratropium (COMBIVENT) 18-103 MCG/ACT inhaler Inhale 2 puffs into the lungs every 4 (four) hours as needed for wheezing or shortness of breath. Reported on 08/13/2015   Yes Historical Provider, MD  Aspirin-Acetaminophen-Caffeine (GOODY HEADACHE PO) Take 1 each by mouth every 6 (six) hours as needed (pain).   Yes Historical Provider, MD  ibuprofen (ADVIL,MOTRIN) 200 MG tablet Take 400 mg by mouth every 6 (six) hours as needed for moderate pain.   Yes Historical Provider, MD  Multiple Vitamin (MULTIVITAMIN WITH MINERALS) TABS tablet Take 1 tablet by mouth daily.   Yes Historical Provider, MD  vitamin B-12 (CYANOCOBALAMIN) 100 MCG tablet Take 100 mcg by mouth daily.   Yes Historical Provider, MD  cephALEXin (KEFLEX) 500 MG capsule Take 1 capsule (500 mg total) by mouth 4 (four) times daily. Patient not taking: Reported on 08/13/2015 07/14/15   [facility] Pilcher Ward, PA-C  cephALEXin (KEFLEX) 500 MG capsule Take 1 capsule (500 mg total) by mouth 4  (four) times daily. Patient not taking: Reported on 09/16/2015 08/13/15   Melene Planan Floyd, DO  chlordiazePOXIDE (LIBRIUM) 25 MG capsule 50mg  PO TID x 1D, then 25-50mg  PO BID X 1D, then 25-50mg  PO QD X 1D Patient not taking: Reported on 02/25/2016 09/16/15   Lorre NickAnthony Allen, MD  cloNIDine (CATAPRES) 0.1 MG tablet 1 tab po tid x 2 days, then bid x 2 days, then once daily x 2 days 02/25/16   Arby BarretteMarcy Russia Scheiderer, MD  HYDROcodone-acetaminophen (NORCO/VICODIN) 5-325 MG tablet Take 2 tablets by mouth every 4 (four) hours as needed. Patient not taking: Reported on 09/16/2015 07/14/15   [facility] Pilcher Ward, PA-C  naproxen (NAPROSYN) 500 MG tablet Take 1 tablet (500 mg total) by mouth 2 (two) times daily. Patient not taking: Reported on 08/13/2015 03/20/15   Linwood DibblesJon Knapp, MD  ondansetron (ZOFRAN ODT) 8 MG disintegrating tablet Take 1 tablet (8 mg total) by mouth every 8 (eight) hours as needed for nausea or vomiting. 09/16/15   Lorre NickAnthony Allen, MD  oxyCODONE (ROXICODONE) 5 MG immediate release tablet Take 1 tablet (5 mg total) by mouth every 4 (four) hours as needed for severe pain. Patient not taking: Reported on 09/16/2015 08/13/15   Melene Planan Floyd, DO  oxyCODONE-acetaminophen (PERCOCET) 5-325 MG per tablet Take 1 tablet by mouth every 6 (six) hours as needed for moderate pain. Patient not taking: Reported on 08/13/2015 03/20/15   Linwood DibblesJon Knapp, MD  promethazine (PHENERGAN) 25 MG  tablet Take 1 tablet (25 mg total) by mouth every 6 (six) hours as needed for nausea or vomiting. 02/25/16   Arby BarretteMarcy Yigit Norkus, MD    Family History History reviewed. No pertinent family history.  Social History Social History  Substance Use Topics  . Smoking status: Current Every Day Smoker    Packs/day: 0.00    Types: Cigarettes  . Smokeless tobacco: Never Used  . Alcohol use No     Comment: former addict     Allergies   Penicillins   Review of Systems Review of Systems 10 Systems reviewed and are negative for acute change except as noted in the HPI.   Physical  Exam Updated Vital Signs BP 120/79   Pulse 94   Temp 99.1 F (37.3 C) (Oral)   Resp 18   SpO2 100%   Physical Exam  Constitutional: He is oriented to person, place, and time. He appears well-developed and well-nourished.  Patient is no acute distress. Mental status is clear. No respiratory distress. He is sitting up in the stretcher and appears mildly uncomfortable.  HENT:  Head: Normocephalic and atraumatic.  Mouth/Throat: Oropharynx is clear and moist.  Eyes: Conjunctivae and EOM are normal. Pupils are equal, round, and reactive to light.  Neck: Neck supple.  Cardiovascular: Normal rate, regular rhythm, normal heart sounds and intact distal pulses.   Pulmonary/Chest: Effort normal and breath sounds normal.  Abdominal: Soft. He exhibits no distension.  Hyperactive bowel sounds. Mild diffuse tenderness to palpation.  Musculoskeletal: Normal range of motion. He exhibits no edema, tenderness or deformity.  Extremities have well conditioned musculature. Patient does have injection track marks on the right upper extremity in 2 areas. There is no associated abscess or induration. Lower extremities are excellent condition with no peripheral edema.  Neurological: He is alert and oriented to person, place, and time. No cranial nerve deficit. He exhibits normal muscle tone. Coordination normal.  Skin: Skin is warm and dry.  Many tattoos. No areas of cellulitis or erythema.     ED Treatments / Results  Labs (all labs ordered are listed, but only abnormal results are displayed) Labs Reviewed  URINE RAPID DRUG SCREEN, HOSP PERFORMED - Abnormal; Notable for the following:       Result Value   Opiates POSITIVE (*)    Cocaine POSITIVE (*)    All other components within normal limits  ACETAMINOPHEN LEVEL - Abnormal; Notable for the following:    Acetaminophen (Tylenol), Serum <10 (*)    All other components within normal limits  CBC WITH DIFFERENTIAL/PLATELET - Abnormal; Notable for the  following:    WBC 10.7 (*)    RBC 5.99 (*)    Hemoglobin 12.2 (*)    HCT 37.7 (*)    MCV 62.9 (*)    MCH 20.4 (*)    RDW 15.8 (*)    Monocytes Absolute 1.2 (*)    All other components within normal limits  COMPREHENSIVE METABOLIC PANEL - Abnormal; Notable for the following:    AST 160 (*)    ALT 375 (*)    All other components within normal limits  SALICYLATE LEVEL  ETHANOL  HEPATITIS PANEL, ACUTE  HIV ANTIBODY (ROUTINE TESTING)    EKG  EKG Interpretation None       Radiology No results found.  Procedures Procedures (including critical care time)  Medications Ordered in ED Medications  cloNIDine (CATAPRES) tablet 0.1 mg (0.1 mg Oral Given 02/25/16 1624)  promethazine (PHENERGAN) tablet 25 mg (25 mg Oral  Given 02/25/16 1624)     Initial Impression / Assessment and Plan / ED Course  I have reviewed the triage vital signs and the nursing notes.  Pertinent labs & imaging results that were available during my care of the patient were reviewed by me and considered in my medical decision making (see chart for details).  Clinical Course     Final Clinical Impressions(s) / ED Diagnoses   Final diagnoses:  Heroin addiction (HCC)  Cocaine abuse  Medical clearance for psychiatric admission   Patient is medically cleared for proceeding to substance abuse treatment program. He is nontoxic and alert. Patient's mental status is clear. He has no respiratory distress. Diagnostic evaluation is within normal limits except for mild elevation and LFT. This is a noncritical finding. Patient denies alcohol use. With history of IV drug abuse, consideration is for possible hepatitis infection. Hepatitis and HIV panels have been added. Patient is advised of the necessity of following up with a primary care provider and following up with these results. New Prescriptions New Prescriptions   CLONIDINE (CATAPRES) 0.1 MG TABLET    1 tab po tid x 2 days, then bid x 2 days, then once daily x 2  days   PROMETHAZINE (PHENERGAN) 25 MG TABLET    Take 1 tablet (25 mg total) by mouth every 6 (six) hours as needed for nausea or vomiting.     Arby Barrette, MD 02/25/16 [phone]

## 2016-02-25 NOTE — ED Triage Notes (Signed)
Pt presents wanting medical clearance for cocaine and heroin detox.  C/o intermittent abdominal pain, n/v/d and "shakes" starting this morning.  Pain score 7/10.  Pt reports heroin and cocaine use around 0530.   Pt reports that he was sent by [facility] for medical clearance.

## 2016-02-26 LAB — HEPATITIS PANEL, ACUTE
HCV Ab: >11 s/co ratio — ABNORMAL HIGH (ref 0.0–0.9)
HEP A IGM: NEGATIVE
HEP B C IGM: NEGATIVE
HEP B S AG: NEGATIVE

## 2016-02-26 LAB — HIV ANTIBODY (ROUTINE TESTING W REFLEX): HIV Screen 4th Generation wRfx: NONREACTIVE

## 2016-03-03 ENCOUNTER — Telehealth (HOSPITAL_BASED_OUTPATIENT_CLINIC_OR_DEPARTMENT_OTHER): Payer: Self-pay | Admitting: Emergency Medicine

## 2016-03-03 NOTE — Telephone Encounter (Signed)
Per Dr. Erma HeritageIsaacs, encourage followup with PCP/ establish care with PCP

## 2016-03-05 ENCOUNTER — Encounter (HOSPITAL_COMMUNITY): Payer: Self-pay | Admitting: Emergency Medicine

## 2016-03-05 ENCOUNTER — Emergency Department (HOSPITAL_COMMUNITY)
Admission: EM | Admit: 2016-03-05 | Discharge: 2016-03-05 | Disposition: A | Discharge status: Home / Self Care | Payer: Self-pay | Attending: Emergency Medicine | Admitting: Emergency Medicine

## 2016-03-05 DIAGNOSIS — Z792 Long term (current) use of antibiotics: Secondary | ICD-10-CM | POA: Insufficient documentation

## 2016-03-05 DIAGNOSIS — Z021 Encounter for pre-employment examination: Secondary | ICD-10-CM | POA: Insufficient documentation

## 2016-03-05 DIAGNOSIS — Z008 Encounter for other general examination: Secondary | ICD-10-CM

## 2016-03-05 DIAGNOSIS — Z79899 Other long term (current) drug therapy: Secondary | ICD-10-CM | POA: Insufficient documentation

## 2016-03-05 DIAGNOSIS — J45909 Unspecified asthma, uncomplicated: Secondary | ICD-10-CM | POA: Insufficient documentation

## 2016-03-05 DIAGNOSIS — F1490 Cocaine use, unspecified, uncomplicated: Secondary | ICD-10-CM | POA: Insufficient documentation

## 2016-03-05 DIAGNOSIS — F1110 Opioid abuse, uncomplicated: Secondary | ICD-10-CM | POA: Insufficient documentation

## 2016-03-05 DIAGNOSIS — F17210 Nicotine dependence, cigarettes, uncomplicated: Secondary | ICD-10-CM | POA: Insufficient documentation

## 2016-03-05 DIAGNOSIS — Z7982 Long term (current) use of aspirin: Secondary | ICD-10-CM | POA: Insufficient documentation

## 2016-03-05 NOTE — Discharge Instructions (Signed)
Return here as needed if her employer request any more advanced testing for clearance for work You will Need to arrange that with them

## 2016-03-05 NOTE — ED Triage Notes (Addendum)
Pt requests work note so that he can return to work. Had back injury some time ago, but feels fine. Also had L hand injury in 1999, which is not causing any barrier to work. Pt ambulated to triage room with no difficulty and denies pain.

## 2016-03-05 NOTE — ED Provider Notes (Signed)
[facility]-EMERGENCY DEPT Provider Note   CSN: [id number] Arrival date & time: 03/05/16  1407     History   Chief Complaint Chief Complaint  Patient presents with  . work note to return to work    HPI Devin Randall is a 50 y.o. male.  HPI Patient presents to the emergency department asking for a note to be able to work.  The patient states that his employer asked him to come to the doctor for a note to be cleared to work.  Patient states that he merely stated that he had had back troubles and hand injury in the past.  He states currently he is having no problems with either Past Medical History:  Diagnosis Date  . Asthma   . Cocaine abuse   . ETOH abuse   . Heroin abuse     There are no active problems to display for this patient.   Past Surgical History:  Procedure Laterality Date  . HAND SURGERY         Home Medications    Prior to Admission medications   Medication Sig Start Date End Date Taking? Authorizing Provider  albuterol-ipratropium (COMBIVENT) 18-103 MCG/ACT inhaler Inhale 2 puffs into the lungs every 4 (four) hours as needed for wheezing or shortness of breath. Reported on 08/13/2015    Historical Provider, MD  Aspirin-Acetaminophen-Caffeine (GOODY HEADACHE PO) Take 1 each by mouth every 6 (six) hours as needed (pain).    Historical Provider, MD  cephALEXin (KEFLEX) 500 MG capsule Take 1 capsule (500 mg total) by mouth 4 (four) times daily. Patient not taking: Reported on 08/13/2015 07/14/15   [facility] LLCJaime Pilcher Ward, PA-C  cephALEXin (KEFLEX) 500 MG capsule Take 1 capsule (500 mg total) by mouth 4 (four) times daily. Patient not taking: Reported on 09/16/2015 08/13/15   Melene Planan Floyd, DO  chlordiazePOXIDE (LIBRIUM) 25 MG capsule 50mg  PO TID x 1D, then 25-50mg  PO BID X 1D, then 25-50mg  PO QD X 1D Patient not taking: Reported on 02/25/2016 09/16/15   Lorre NickAnthony Allen, MD  cloNIDine (CATAPRES) 0.1 MG tablet 1 tab po tid x 2 days, then bid x 2 days, then once daily x 2 days 02/25/16    Arby BarretteMarcy Pfeiffer, MD  HYDROcodone-acetaminophen (NORCO/VICODIN) 5-325 MG tablet Take 2 tablets by mouth every 4 (four) hours as needed. Patient not taking: Reported on 09/16/2015 07/14/15   [facility] Pil[facility], PA-C  ibuprofen (ADVIL,MOTRIN) 200 MG tablet Take 400 mg by mouth every 6 (six) hours as needed for moderate pain.    Historical Provider, MD  Multiple Vitamin (MULTIVITAMIN WITH MINERALS) TABS tablet Take 1 tablet by mouth daily.    Historical Provider, MD  naproxen (NAPROSYN) 500 MG tablet Take 1 tablet (500 mg total) by mouth 2 (two) times daily. Patient not taking: Reported on 08/13/2015 03/20/15   Linwood DibblesJon Knapp, MD  ondansetron (ZOFRAN ODT) 8 MG disintegrating tablet Take 1 tablet (8 mg total) by mouth every 8 (eight) hours as needed for nausea or vomiting. 09/16/15   Lorre NickAnthony Allen, MD  oxyCODONE (ROXICODONE) 5 MG immediate release tablet Take 1 tablet (5 mg total) by mouth every 4 (four) hours as needed for severe pain. Patient not taking: Reported on 09/16/2015 08/13/15   Melene Planan Floyd, DO  oxyCODONE-acetaminophen (PERCOCET) 5-325 MG per tablet Take 1 tablet by mouth every 6 (six) hours as needed for moderate pain. Patient not taking: Reported on 08/13/2015 03/20/15   Linwood DibblesJon Knapp, MD  promethazine (PHENERGAN) 25 MG tablet Take 1 tablet (25 mg  total) by mouth every 6 (six) hours as needed for nausea or vomiting. 02/25/16   Arby Barrette, MD  vitamin B-12 (CYANOCOBALAMIN) 100 MCG tablet Take 100 mcg by mouth daily.    Historical Provider, MD    Family History History reviewed. No pertinent family history.  Social History Social History  Substance Use Topics  . Smoking status: Current Every Day Smoker    Packs/day: 0.00    Types: Cigarettes  . Smokeless tobacco: Never Used  . Alcohol use No     Comment: former addict     Allergies   Penicillins   Review of Systems Review of Systems  All other systems negative except as documented in the HPI. All pertinent positives and negatives as reviewed in the  HPI. Physical Exam Updated Vital Signs BP 150/93   Pulse 74   Temp 99.5 F (37.5 C) (Oral)   Resp 16   SpO2 99%   Physical Exam  Constitutional: He is oriented to person, place, and time. He appears well-developed and well-nourished. No distress.  HENT:  Head: Normocephalic and atraumatic.  Mouth/Throat: Oropharynx is clear and moist.  Eyes: Pupils are equal, round, and reactive to light.  Neck: Normal range of motion. Neck supple.  Cardiovascular: Normal rate, regular rhythm and normal heart sounds.  Exam reveals no gallop and no friction rub.   No murmur heard. Pulmonary/Chest: Effort normal and breath sounds normal. No respiratory distress. He has no wheezes.  Musculoskeletal:       Lumbar back: Normal.  Neurological: He is alert and oriented to person, place, and time. He has normal reflexes. He exhibits normal muscle tone. Coordination normal.  Skin: Skin is warm and dry. No rash noted. No erythema.  Psychiatric: He has a normal mood and affect. His behavior is normal.  Nursing note and vitals reviewed.    ED Treatments / Results  Labs (all labs ordered are listed, but only abnormal results are displayed) Labs Reviewed - No data to display  EKG  EKG Interpretation None       Radiology No results found.  Procedures Procedures (including critical care time)  Medications Ordered in ED Medications - No data to display   Initial Impression / Assessment and Plan / ED Course  I have reviewed the triage vital signs and the nursing notes.  Pertinent labs & imaging results that were available during my care of the patient were reviewed by me and considered in my medical decision making (see chart for details).  Clinical Course   I advised the patient that I cannot do any advanced testing for clearance, but that I could give him a note stating that he feels that he cannot work.  Advised the patient that if they want any further clearance that he would need to  6-last follow-up  Final Clinical Impressions(s) / ED Diagnoses   Final diagnoses:  None    New Prescriptions New Prescriptions   No medications on file     Charlestine Night, PA-C 03/05/16 1530    Samuel Jester, DO 03/06/16 1539

## 2016-04-28 ENCOUNTER — Telehealth (HOSPITAL_BASED_OUTPATIENT_CLINIC_OR_DEPARTMENT_OTHER): Payer: Self-pay | Admitting: Emergency Medicine

## 2016-04-28 NOTE — Telephone Encounter (Signed)
Lost to followup 

## 2016-06-11 ENCOUNTER — Emergency Department (HOSPITAL_COMMUNITY)
Admission: EM | Admit: 2016-06-11 | Discharge: 2016-06-11 | Disposition: A | Discharge status: Home / Self Care | Payer: Self-pay | Attending: Physician Assistant | Admitting: Physician Assistant

## 2016-06-11 ENCOUNTER — Encounter (HOSPITAL_COMMUNITY): Payer: Self-pay | Admitting: Emergency Medicine

## 2016-06-11 DIAGNOSIS — F17210 Nicotine dependence, cigarettes, uncomplicated: Secondary | ICD-10-CM | POA: Insufficient documentation

## 2016-06-11 DIAGNOSIS — Z7982 Long term (current) use of aspirin: Secondary | ICD-10-CM | POA: Insufficient documentation

## 2016-06-11 DIAGNOSIS — L253 Unspecified contact dermatitis due to other chemical products: Secondary | ICD-10-CM

## 2016-06-11 DIAGNOSIS — J45909 Unspecified asthma, uncomplicated: Secondary | ICD-10-CM | POA: Insufficient documentation

## 2016-06-11 DIAGNOSIS — L245 Irritant contact dermatitis due to other chemical products: Secondary | ICD-10-CM | POA: Insufficient documentation

## 2016-06-11 MED ORDER — MUPIROCIN CALCIUM 2 % EX CREA: 1.0000 "application " | TOPICAL_CREAM | Freq: Two times a day (BID) | CUTANEOUS | 0 refills | Status: AC

## 2016-06-11 MED ORDER — PREDNISONE 20 MG PO TABS: 40.0000 mg | ORAL_TABLET | Freq: Every day | ORAL | 0 refills | Status: DC

## 2016-06-11 MED ORDER — PREDNISONE 20 MG PO TABS
60.0000 mg | ORAL_TABLET | Freq: Once | ORAL | Status: AC
  Administered 2016-06-11: 60 mg via ORAL
  Filled 2016-06-11: qty 3

## 2016-06-11 MED ORDER — HYDROXYZINE HCL 25 MG PO TABS: 25.0000 mg | ORAL_TABLET | Freq: Four times a day (QID) | ORAL | 0 refills | Status: AC

## 2016-06-11 NOTE — ED Notes (Signed)
Pt presents with a rash on his hands and face.  Pt reports the areas are itching and causing him pain.

## 2016-06-11 NOTE — ED Triage Notes (Signed)
Per pt, states he was laying bead board with some strong adhesive-states he woke up in the middle of night and felt like he got some wind burn on his face and hands-notice rash on face and hands-thinks it is a reaction

## 2016-06-11 NOTE — Discharge Instructions (Signed)
Please read and follow all provided instructions.  Your diagnoses today include:  1. Chemical dermatitis     Tests performed today include:  Vital signs. See below for your results today.   Medications prescribed:   Prednisone - steroid medicine   It is best to take this medication in the morning to prevent sleeping problems. If you are diabetic, monitor your blood sugar closely and stop taking Prednisone if blood sugar is over 300. Take with food to prevent stomach upset.    Hydroxyzine - antihistamine  You can find this medication over-the-counter.   This medication will make you drowsy. DO NOT drive or perform any activities that require you to be awake and alert if taking this.   Take any prescribed medications only as directed.  Home care instructions:   Follow any educational materials contained in this packet  Follow-up instructions: Please follow-up with your primary care provider in the next 3 days for further evaluation of your symptoms.   Return instructions:   Please return to the Emergency Department if you experience worsening symptoms.   Call 9-1-1 immediately if you have an allergic reaction that involves your lips, mouth, throat or if you have any difficulty breathing. This is a life-threatening emergency.   Please return if you have any other emergent concerns.  Additional Information:  Your vital signs today were: BP 149/88 (BP Location: Right Arm)    Pulse 106    Temp 97.6 F (36.4 C) (Oral)    Resp 20    Ht 5\' 10"  (1.778 m)    Wt 79.4 kg    SpO2 96%    BMI 25.11 kg/m  If your blood pressure (BP) was elevated above 135/85 this visit, please have this repeated by your doctor within one month. --------------

## 2016-06-11 NOTE — ED Provider Notes (Signed)
[facility]-EMERGENCY DEPT Provider Note   CSN: [id number] Arrival date & time: 06/11/16  1406  By signing my name below, I, [name], attest that this documentation has been prepared under the direction and in the presence of Devin CriglerJoshua Sheril Randall, [place] JerseyPA-C. Electronically Signed: [name], ED Scribe. 06/11/2016. 2:30 PM.    History   Chief Complaint Chief Complaint  Patient presents with  . Rash    The history is provided by the patient. No language interpreter was used.   HPI Comments:  Devin Randall is a 50 y.o. male who presents to the Emergency Department complaining of a worsening rash that began last night. Pt notes a rash to his hands, forearms, and face. Pt states he was applying paneling to a wall with gloves and there was a strong adhesive on the material that he believes has caused him to break out. Pt states that the rash is burning and painful. Pt reports that he had blisters on his hands that have moderately improved. Pt reports that he used powderless latex gloves today, and notes a known allergy to latex. Pt states that he thoroughly washed his skin after noticing the rash. No alleviating factors noted. Pt denies other associated symptoms.    Past Medical History:  Diagnosis Date  . Asthma   . Cocaine abuse   . ETOH abuse   . Heroin abuse     There are no active problems to display for this patient.   Past Surgical History:  Procedure Laterality Date  . HAND SURGERY         Home Medications    Prior to Admission medications   Medication Sig Start Date End Date Taking? Authorizing Provider  albuterol-ipratropium (COMBIVENT) 18-103 MCG/ACT inhaler Inhale 2 puffs into the lungs every 4 (four) hours as needed for wheezing or shortness of breath. Reported on 08/13/2015    Historical Provider, MD  Aspirin-Acetaminophen-Caffeine (GOODY HEADACHE PO) Take 1 each by mouth every 6 (six) hours as needed (pain).    Historical Provider, MD  cloNIDine (CATAPRES)  0.1 MG tablet 1 tab po tid x 2 days, then bid x 2 days, then once daily x 2 days 02/25/16   Arby BarretteMarcy Pfeiffer, MD  hydrOXYzine (ATARAX/VISTARIL) 25 MG tablet Take 1 tablet (25 mg total) by mouth every 6 (six) hours. 06/11/16   Devin CriglerJoshua Dawnna Gritz, PA-C  ibuprofen (ADVIL,MOTRIN) 200 MG tablet Take 400 mg by mouth every 6 (six) hours as needed for moderate pain.    Historical Provider, MD  Multiple Vitamin (MULTIVITAMIN WITH MINERALS) TABS tablet Take 1 tablet by mouth daily.    Historical Provider, MD  mupirocin cream (BACTROBAN) 2 % Apply 1 application topically 2 (two) times daily. 06/11/16   Devin CriglerJoshua Devin Brandenburger, PA-C  ondansetron (ZOFRAN ODT) 8 MG disintegrating tablet Take 1 tablet (8 mg total) by mouth every 8 (eight) hours as needed for nausea or vomiting. 09/16/15   Lorre NickAnthony Allen, MD  predniSONE (DELTASONE) 20 MG tablet Take 2 tablets (40 mg total) by mouth daily. 06/11/16   Devin CriglerJoshua Devin Hollingshead, PA-C  promethazine (PHENERGAN) 25 MG tablet Take 1 tablet (25 mg total) by mouth every 6 (six) hours as needed for nausea or vomiting. 02/25/16   Arby BarretteMarcy Pfeiffer, MD  vitamin B-12 (CYANOCOBALAMIN) 100 MCG tablet Take 100 mcg by mouth daily.    Historical Provider, MD    Family History No family history on file.  Social History Social History  Substance Use Topics  . Smoking status: Current Every Day Smoker  Packs/day: 0.00    Types: Cigarettes  . Smokeless tobacco: Never Used  . Alcohol use No     Comment: former addict     Allergies   Penicillins   Review of Systems Review of Systems  Constitutional: Negative for fever.  HENT: Negative for facial swelling and trouble swallowing.   Eyes: Negative for redness.  Respiratory: Negative for shortness of breath, wheezing and stridor.   Cardiovascular: Negative for chest pain.  Gastrointestinal: Negative for nausea and vomiting.  Musculoskeletal: Negative for myalgias.  Skin: Positive for rash.  Neurological: Negative for light-headedness.    Psychiatric/Behavioral: Negative for confusion.     Physical Exam Updated Vital Signs BP 149/88 (BP Location: Right Arm)   Pulse 106   Temp 97.6 F (36.4 C) (Oral)   Resp 20   Ht 5\' 10"  (1.778 m)   Wt 175 lb (79.4 kg)   SpO2 96%   BMI 25.11 kg/m   Physical Exam  Constitutional: He appears well-developed and well-nourished. No distress.  HENT:  Head: Normocephalic and atraumatic.  Eyes: Conjunctivae are normal.  Cardiovascular: Normal rate.   Pulmonary/Chest: Effort normal.  Abdominal: He exhibits no distension.  Neurological: He is alert.  Skin: Skin is warm and dry.  Patient with rash c/w dermatitis in glove distribution and on face. Moderate excoriations in all areas. No drainage or cellulitis.  Psychiatric: He has a normal mood and affect.  Nursing note and vitals reviewed.    ED Treatments / Results  DIAGNOSTIC STUDIES:  Oxygen Saturation is 96% on RA, normal by my interpretation.    COORDINATION OF CARE:  2:30 PM Will prescribe prednisone, hydroxyzine, Bactroban. Discussed treatment plan with pt at bedside and pt agreed to plan.    Procedures Procedures (including critical care time)  Medications Ordered in ED Medications  predniSONE (DELTASONE) tablet 60 mg (60 mg Oral Given 06/11/16 1451)     Initial Impression / Assessment and Plan / ED Course  I have reviewed the triage vital signs and the nursing notes.  Pertinent labs & imaging results that were available during my care of the patient were reviewed by me and considered in my medical decision making (see chart for details).  Clinical Course    Vital signs reviewed and are as follows: Vitals:   06/11/16 1416  BP: 149/88  Pulse: 106  Resp: 20  Temp: 97.6 F (36.4 C)   Patient counseled on avoidance of contacts which may be causing the rash. We also discussed careful avoidance of latex gloves.  Pt urged to return with worsening pain, worsening swelling, expanding area of redness or  streaking up extremity, fever, or any other concerns. Pt verbalizes understanding and agrees with plan.  Encourage return with worsening symptoms or other concerns.   Final Clinical Impressions(s) / ED Diagnoses   Final diagnoses:  Chemical dermatitis   Patient with likely chemical contact dermatitis. This may be due to aerosolized adhesive or possibly latex. No symptoms of anaphylaxis. Will treat conservatively with steroids. Also given topical antibiotics given extensive excoriations   New Prescriptions Discharge Medication List as of 06/11/2016  2:46 PM    START taking these medications   Details  hydrOXYzine (ATARAX/VISTARIL) 25 MG tablet Take 1 tablet (25 mg total) by mouth every 6 (six) hours., Starting Fri 06/11/2016, Print    mupirocin cream (BACTROBAN) 2 % Apply 1 application topically 2 (two) times daily., Starting Fri 06/11/2016, Print    predniSONE (DELTASONE) 20 MG tablet Take 2 tablets (40 mg  total) by mouth daily., Starting Fri 06/11/2016, Print      I personally performed the services described in this documentation, which was scribed in my presence. The recorded information has been reviewed and is accurate.     Devin Crigler, PA-C 06/11/16 1811    Nira Conn, MD 06/13/16 Marlyne Beards

## 2017-03-08 ENCOUNTER — Encounter (HOSPITAL_COMMUNITY): Payer: Self-pay

## 2017-03-08 ENCOUNTER — Emergency Department (HOSPITAL_COMMUNITY)
Admission: EM | Admit: 2017-03-08 | Discharge: 2017-03-08 | Disposition: A | Discharge status: Home / Self Care | Payer: Self-pay | Attending: Emergency Medicine | Admitting: Emergency Medicine

## 2017-03-08 ENCOUNTER — Emergency Department (HOSPITAL_COMMUNITY): Payer: Self-pay

## 2017-03-08 DIAGNOSIS — R1011 Right upper quadrant pain: Secondary | ICD-10-CM | POA: Insufficient documentation

## 2017-03-08 DIAGNOSIS — R070 Pain in throat: Secondary | ICD-10-CM | POA: Insufficient documentation

## 2017-03-08 DIAGNOSIS — J029 Acute pharyngitis, unspecified: Secondary | ICD-10-CM

## 2017-03-08 DIAGNOSIS — J45909 Unspecified asthma, uncomplicated: Secondary | ICD-10-CM | POA: Insufficient documentation

## 2017-03-08 DIAGNOSIS — Z79899 Other long term (current) drug therapy: Secondary | ICD-10-CM | POA: Insufficient documentation

## 2017-03-08 DIAGNOSIS — R300 Dysuria: Secondary | ICD-10-CM | POA: Insufficient documentation

## 2017-03-08 DIAGNOSIS — R110 Nausea: Secondary | ICD-10-CM | POA: Insufficient documentation

## 2017-03-08 HISTORY — DX: Unspecified viral hepatitis C without hepatic coma: B19.20

## 2017-03-08 LAB — COMPREHENSIVE METABOLIC PANEL
ALT: 104 U/L — AB (ref 17–63)
AST: 62 U/L — ABNORMAL HIGH (ref 15–41)
Albumin: 3.8 g/dL (ref 3.5–5.0)
Alkaline Phosphatase: 80 U/L (ref 38–126)
Anion gap: 7 (ref 5–15)
BILIRUBIN TOTAL: 0.7 mg/dL (ref 0.3–1.2)
BUN: 11 mg/dL (ref 6–20)
CHLORIDE: 107 mmol/L (ref 101–111)
CO2: 25 mmol/L (ref 22–32)
CREATININE: 1.13 mg/dL (ref 0.61–1.24)
Calcium: 8.8 mg/dL — ABNORMAL LOW (ref 8.9–10.3)
Glucose, Bld: 94 mg/dL (ref 65–99)
Potassium: 5.1 mmol/L (ref 3.5–5.1)
Sodium: 139 mmol/L (ref 135–145)
TOTAL PROTEIN: 7.1 g/dL (ref 6.5–8.1)

## 2017-03-08 LAB — URINALYSIS, ROUTINE W REFLEX MICROSCOPIC
Bacteria, UA: NONE SEEN
Bilirubin Urine: NEGATIVE
Glucose, UA: NEGATIVE mg/dL
Ketones, ur: NEGATIVE mg/dL
LEUKOCYTES UA: NEGATIVE
Nitrite: NEGATIVE
PH: 5 (ref 5.0–8.0)
Protein, ur: NEGATIVE mg/dL
SPECIFIC GRAVITY, URINE: 1.011 (ref 1.005–1.030)
SQUAMOUS EPITHELIAL / LPF: NONE SEEN

## 2017-03-08 LAB — RAPID STREP SCREEN (MED CTR MEBANE ONLY): STREPTOCOCCUS, GROUP A SCREEN (DIRECT): NEGATIVE

## 2017-03-08 LAB — CBC
HEMATOCRIT: 39.1 % (ref 39.0–52.0)
Hemoglobin: 12.2 g/dL — ABNORMAL LOW (ref 13.0–17.0)
MCH: 20.5 pg — ABNORMAL LOW (ref 26.0–34.0)
MCHC: 31.2 g/dL (ref 30.0–36.0)
MCV: 65.6 fL — ABNORMAL LOW (ref 78.0–100.0)
PLATELETS: 264 10*3/uL (ref 150–400)
RBC: 5.96 MIL/uL — ABNORMAL HIGH (ref 4.22–5.81)
RDW: 18.1 % — AB (ref 11.5–15.5)
WBC: 13.1 10*3/uL — AB (ref 4.0–10.5)

## 2017-03-08 LAB — RAPID HIV SCREEN (HIV 1/2 AB+AG)
HIV 1/2 ANTIBODIES: NONREACTIVE
HIV-1 P24 Antigen - HIV24: NONREACTIVE

## 2017-03-08 LAB — LIPASE, BLOOD: LIPASE: 71 U/L — AB (ref 11–51)

## 2017-03-08 MED ORDER — IOPAMIDOL (ISOVUE-300) INJECTION 61%
INTRAVENOUS | Status: AC
  Administered 2017-03-08: 100 mL
  Filled 2017-03-08: qty 100

## 2017-03-08 NOTE — ED Notes (Addendum)
Pt c/o sore throat started yesterday states had white patches on throat last night.  Also states was recently diagnosed with HEP C -- while in jail, now having right upper quad pain, diarrhea x 3 days-

## 2017-03-08 NOTE — ED Provider Notes (Signed)
[facility]-EMERGENCY DEPT Provider Note   CSN: [id number] Arrival date & time: 03/08/17  0830     History   Chief Complaint Chief Complaint  Patient presents with  . sore throat, ear pain  . Abdominal Pain  . Hepatitis B    HPI Devin Randall is a 51 y.o. male with a h/o of alcohol, Heroin, and cocaine abuse, and untreated Hepatitis C who presents to the emergency department with a chief complaint of sore throat x 2 days. He states that he had a red, pruritic rash on his scalp the first night the sore throat began, but states it resolved spontaneously the next morning.  He also complains of constant, dull, aching right upper quadrant pain that began 1-2 weeks ago. He reports the pain is somewhat improved by laying on his right side no aggravating factors. He reports associated nausea and diarrhea x 3 days. He notes a small amount of bright red blood per rectum on the toilet tissue after wiping. He reports a remote history of hemorrhoids, but states he has had no problems in several years. He denies fever, chills, melena or emesis, but states "he doesn't feel quite right". He also reports a history of chronic low back pain, reports that over the last 2 days that his back pain has worsened and states it hurts "right around his right kidney" He also endorses dysuria x and frequency 2 days. No hematuria, hesitancy, penile discharge, or testicular pain or swelling. He reports that he treated his symptoms at home with 3 tablets of ibuprofen for the last 4 days. He reports that until 6 months ago he used to take 4-6 daily powders daily and approximately one year ago was taking 12-18 Goody powders per day while he was using Heroin.  He was recently released from prison and was diagnosed with Hep C while incarcerated.   The history is provided by the patient. No language interpreter was used.    Past Medical History:  Diagnosis Date  . Asthma   . Cocaine abuse   . ETOH abuse   . Hepatitis C   .  Heroin abuse     There are no active problems to display for this patient.   Past Surgical History:  Procedure Laterality Date  . HAND SURGERY         Home Medications    Prior to Admission medications   Medication Sig Start Date End Date Taking? Authorizing Provider  albuterol-ipratropium (COMBIVENT) 18-103 MCG/ACT inhaler Inhale 2 puffs into the lungs every 4 (four) hours as needed for wheezing or shortness of breath. Reported on 08/13/2015    [provider]  Aspirin-Acetaminophen-Caffeine (GOODY HEADACHE PO) Take 1 each by mouth every 6 (six) hours as needed (pain).    [provider]  cloNIDine (CATAPRES) 0.1 MG tablet 1 tab po tid x 2 days, then bid x 2 days, then once daily x 2 days 02/25/16   Arby Barrette, MD  hydrOXYzine (ATARAX/VISTARIL) 25 MG tablet Take 1 tablet (25 mg total) by mouth every 6 (six) hours. 06/11/16   Renne Crigler, PA-C  ibuprofen (ADVIL,MOTRIN) 200 MG tablet Take 400 mg by mouth every 6 (six) hours as needed for moderate pain.    [provider]  Multiple Vitamin (MULTIVITAMIN WITH MINERALS) TABS tablet Take 1 tablet by mouth daily.    [provider]  mupirocin cream (BACTROBAN) 2 % Apply 1 application topically 2 (two) times daily. 06/11/16   Renne Crigler, PA-C  ondansetron [facility]  ODT) 8 MG disintegrating tablet Take 1 tablet (8 mg total) by mouth every 8 (eight) hours as needed for nausea or vomiting. 09/16/15   Lorre Nick, MD  predniSONE (DELTASONE) 20 MG tablet Take 2 tablets (40 mg total) by mouth daily. 06/11/16   Renne Crigler, PA-C  promethazine (PHENERGAN) 25 MG tablet Take 1 tablet (25 mg total) by mouth every 6 (six) hours as needed for nausea or vomiting. 02/25/16   Arby Barrette, MD  vitamin B-12 (CYANOCOBALAMIN) 100 MCG tablet Take 100 mcg by mouth daily.    [provider]    Family History No family history on file.  Social History Social History  Substance Use Topics  . Smoking  status: Current Every Day Smoker    Packs/day: 0.00    Types: Cigarettes  . Smokeless tobacco: Never Used  . Alcohol use No     Comment: former addict     Allergies   Penicillins   Review of Systems Review of Systems  Constitutional: Negative for activity change, chills and fever.  HENT: Positive for sore throat.   Respiratory: Negative for shortness of breath.   Cardiovascular: Negative for chest pain.  Gastrointestinal: Positive for abdominal pain, blood in stool, diarrhea and nausea. Negative for vomiting.  Genitourinary: Positive for dysuria and frequency. Negative for penile swelling, scrotal swelling and testicular pain.  Musculoskeletal: Positive for back pain.  Skin: Negative for rash.   Physical Exam Updated Vital Signs BP 133/82 (BP Location: Right Arm)   Pulse 68   Temp 97.9 F (36.6 C) (Oral)   Resp 16   Ht 5\' 9"  (1.753 m)   Wt 81.6 kg (180 lb)   SpO2 98%   BMI 26.58 kg/m   Physical Exam  Constitutional: He appears well-developed.  HENT:  Head: Normocephalic.  Numerous small pustules located throughout the distribution of the patient's hair. There are several similar lesions that are crusting.   Eyes: Conjunctivae are normal.  Neck: Neck supple.  Cardiovascular: Normal rate, regular rhythm and intact distal pulses.  Exam reveals no gallop and no friction rub.   No murmur heard. Pulmonary/Chest: Effort normal and breath sounds normal. No respiratory distress. He has no wheezes.  Abdominal: Soft. Bowel sounds are normal. He exhibits no distension and no mass. There is tenderness. There is no rebound and no guarding. No hernia.  Hyperactive bowel sounds in all 4 quadrants. Mild tenderness to palpation to the right upper quadrant. No Murphy sign. Moderate tenderness to palpation over the left lower quadrant. No rebound tenderness or guarding. The remainder of the abdominal exam is unremarkable. Mild right CVA tenderness. Left CVA is non-tender.    Musculoskeletal:  No lower extremity edema. No tenderness to palpation of the spinous processes of the cervical or thoracic spine or surrounding paraspinal muscles. Mild tenderness to palpation of the spinous processes of the lumbar spine. Minimal surrounding paraspinal muscle tenderness to the lumbar spine.  Neurological: He is alert.  Skin: Skin is warm and dry.  Psychiatric: His behavior is normal.  Nursing note and vitals reviewed.    ED Treatments / Results  Labs (all labs ordered are listed, but only abnormal results are displayed) Labs Reviewed  COMPREHENSIVE METABOLIC PANEL - Abnormal; Notable for the following:       Result Value   Calcium 8.8 (*)    AST 62 (*)    ALT 104 (*)    All other components within normal limits  LIPASE, BLOOD - Abnormal; Notable for the following:  Lipase 71 (*)    All other components within normal limits  CBC - Abnormal; Notable for the following:    WBC 13.1 (*)    RBC 5.96 (*)    Hemoglobin 12.2 (*)    MCV 65.6 (*)    MCH 20.5 (*)    RDW 18.1 (*)    All other components within normal limits  URINALYSIS, ROUTINE W REFLEX MICROSCOPIC - Abnormal; Notable for the following:    Hgb urine dipstick MODERATE (*)    All other components within normal limits  RAPID STREP SCREEN (NOT AT [facility])  CULTURE, GROUP A STREP ([facility])  RAPID HIV SCREEN (HIV 1/2 AB+AG)  HEPATITIS PANEL, ACUTE  RPR    EKG  EKG Interpretation None       Radiology Ct Abdomen Pelvis W Contrast  Result Date: 03/08/2017 CLINICAL DATA:  Right upper quadrant pain for 2 weeks. EXAM: CT ABDOMEN AND PELVIS WITH CONTRAST TECHNIQUE: Multidetector CT imaging of the abdomen and pelvis was performed using the standard protocol following bolus administration of intravenous contrast. CONTRAST:  ISOVUE-300 IOPAMIDOL (ISOVUE-300) INJECTION 61% COMPARISON:  08/23/2013 FINDINGS: Lower chest: Lung bases are clear. No effusions. Heart is normal size. Hepatobiliary: Scattered  hypodensities in the liver, likely small cysts. Gallbladder unremarkable. Pancreas: No focal abnormality or ductal dilatation. Spleen: No focal abnormality.  Normal size. Adrenals/Urinary Tract: No adrenal abnormality. No focal renal abnormality. No stones or hydronephrosis. Urinary bladder is unremarkable. Stomach/Bowel: Appendix is normal. Stomach, large and small bowel grossly unremarkable. Vascular/Lymphatic: No evidence of aneurysm or adenopathy. Reproductive: No visible focal abnormality. Other: No free fluid or free air. Musculoskeletal: No acute bony abnormality. IMPRESSION: No acute findings in the abdomen or pelvis. Normal appendix. Electronically Signed   By: Charlett Nose M.D.   On: 03/08/2017 13:22    Procedures Procedures (including critical care time)  Medications Ordered in ED Medications  iopamidol (ISOVUE-300) 61 % injection (100 mLs  Contrast Given 03/08/17 1311)     Initial Impression / Assessment and Plan / ED Course  I have reviewed the triage vital signs and the nursing notes.  Pertinent labs & imaging results that were available during my care of the patient were reviewed by me and considered in my medical decision making (see chart for details).     51 year old male presenting with multiple complaints. He complains of a sore throat. Rapid strep negative.No concern for respiratory distress. No peritonsillar abscess. Low suspicion for deep space infection. The patient appears to have a healing folliculitis to the scalp. No new lesions. Unsure of the source of this rash. However, it appears to be healing without treatment.   Patient also complains of right upper quadrant pain and diarrhea. He reports that he was diagnosed with hepatitis C about one year ago. He has not received treatment. He currently does not have any medical insurance nor follow-up. Consulted case management who was able to establish the patient with a primary care provider and a follow-up appointment.  Hepatitis panel pending. Patient also complained of left lower quadrant pain and minimal bright red blood per rectum. Patient also presented with dysuria and some worse than baseline low back pain. UA with hemoglobin; however, not concerning for infection. CT abdomen pelvis does not demonstrate diverticulitis, appendicitis, hydronephrosis, or other intra-abdominal pathology at this time. Discussed with the patient that I am uncertain of the source of the hemoglobin and his urine and his most recent urine one year ago also contained hemoglobin.  Encouraged the patient  to discuss this with his primary care provider for further outpatient workup. No joint abnormalities. The patient does have a leukocytosis of 13; however,this is possibly related to the patient's hepatitis. No other source identified throughout the physical exam or clinical workup today. Strict return precautions given. No acute distress. The patient is safe for discharge at this time.  Final Clinical Impressions(s) / ED Diagnoses   Final diagnoses:  Right upper quadrant abdominal pain  Sore throat    New Prescriptions Discharge Medication List as of 03/08/2017  3:16 PM       Frederik Pear A, PA-C 03/08/17 1540    Mancel Bale, MD 03/09/17 0710

## 2017-03-08 NOTE — ED Notes (Signed)
Case management scheduled follow up appt.

## 2017-03-08 NOTE — Discharge Instructions (Signed)
Please keep your appointment on 03/21/2017 at 2 PM with the [facility]. You can download the app My Chart onto your smartphone to view the results of all of the tests ordered today. Your hepatitis panel is pending. If abnormal, someone will call you at the number you provided registration today. Those results will also be available after 48 hours through the app.   When you follow up with primary care, please let them know that microscopic blood was seen in your urine and we did not find a cause during your workup today.  You can treat your sore throat with warm and cold drinks to help with the pain. Antibiotics are not indicated at this time. If you have new or worsening symptoms, including a fever that does not improve with ibuprofen, lime green or bloody vomiting, or other new symptoms, please return to the emergency department for reevaluation.

## 2017-03-08 NOTE — ED Triage Notes (Signed)
Patient complains of 2 days of sore throat and now has bilateral ear discomfort. Reports pain with swallowing. NAD. Also reports hep C and has had no treatment and denies any RUQ pain

## 2017-03-08 NOTE — ED Notes (Signed)
Pt. To CT via stretcher. 

## 2017-03-09 LAB — HEPATITIS PANEL, ACUTE
HCV Ab: >11 s/co ratio — ABNORMAL HIGH (ref 0.0–0.9)
HEP B C IGM: NEGATIVE
Hep A IgM: NEGATIVE
Hepatitis B Surface Ag: NEGATIVE

## 2017-03-09 LAB — RPR: RPR Ser Ql: NONREACTIVE

## 2017-03-10 LAB — CULTURE, GROUP A STREP (THRC)

## 2017-03-21 ENCOUNTER — Ambulatory Visit (INDEPENDENT_AMBULATORY_CARE_PROVIDER_SITE_OTHER): Payer: Self-pay | Admitting: Family Medicine

## 2017-03-21 ENCOUNTER — Ambulatory Visit: Payer: Self-pay | Admitting: Family Medicine

## 2017-03-21 ENCOUNTER — Encounter: Payer: Self-pay | Admitting: Family Medicine

## 2017-03-21 ENCOUNTER — Ambulatory Visit (HOSPITAL_COMMUNITY)
Admission: RE | Admit: 2017-03-21 | Discharge: 2017-03-21 | Disposition: A | Discharge status: Home / Self Care | Payer: No Typology Code available for payment source | Source: Ambulatory Visit | Attending: Family Medicine | Admitting: Family Medicine

## 2017-03-21 VITALS — BP 130/90 | HR 100 | Resp 14 | Ht 69.0 in | Wt 185.0 lb

## 2017-03-21 DIAGNOSIS — R109 Unspecified abdominal pain: Secondary | ICD-10-CM | POA: Diagnosis not present

## 2017-03-21 DIAGNOSIS — R319 Hematuria, unspecified: Secondary | ICD-10-CM | POA: Diagnosis present

## 2017-03-21 DIAGNOSIS — B1920 Unspecified viral hepatitis C without hepatic coma: Secondary | ICD-10-CM

## 2017-03-21 NOTE — Patient Instructions (Signed)
Go down and obtain your KUB study to rule out renal stone.  Complete Software engineerinancial Assistance Application.  You have been referred to Infectious Disease for further evaluation of hepatitis C.    Hepatitis C Hepatitis C is a liver infection. It is caused by a germ that can spread through blood and other bodily fluids. Your doctor will use blood and liver tests to:  Check for this infection.  Decide how to treat you.  Check your health after treatment.  Follow these instructions at home:  Rest.  Do not take any medicine unless your doctor says it is okay. This includes over-the-counter medicine and birth control pills.  Do not drink alcohol.  Do not have sex until your doctor says it is okay.  Do not share toothbrushes, nail clippers, razors, or needles.  Take all medicines as told by your doctor. Contact a doctor if:  You have a fever.  Your belly (abdomen) hurts.  Your pee (urine) is dark.  Your poop (bowel movement) is the color of clay.  You have joint pain. Get help right away if:  You feel more and more tired (fatigued).  You feel more and more weak.  You do not feel like eating.  You feel sick to your stomach (nauseous) or throw up (vomit).  Your skin or the whites of your eyes turn yellow (jaundice) or turn more yellow than they were before.  You bruise or bleed easily. This information is not intended to replace advice given to you by your health care provider. Make sure you discuss any questions you have with your health care provider. Document Released: 06/10/2008 Document Revised: 12/04/2015 Document Reviewed: 10/10/2013 Elsevier Interactive Patient Education  2017 [employer]

## 2017-03-21 NOTE — Progress Notes (Signed)
Patient ID: Devin Randall, male    DOB: Apr 08, 1966, 51 y.o.   MRN: [id number]  PCP: Devin Neighbors, FNP  Chief Complaint  Patient presents with  . Establish Care  . Hospitalization Follow-up    Subjective:  HPI Devin Randall is a 51 y.o. male, newly diagnosed hepatitis C, polysubstance abuse, imprisonment,  presents to establish care and hospital follow-up. Devin Randall recently presented to the emergency department with a complaint of RUQ pain which CT of abdomen could not identify an underlying source for pain. Patient admits to chronically taking NSAIDS and Goody powders multiple times per day for chronic back pain. He has a history of opioid addiction and is currently living in a drug recovery house. He reports that he has been drug free for about 120 days. Today he continues to complain of bilateral flank pain and dysuria. CT did not note the presence of renal stones. He denies visible hematuria or darkening of urine, fever, chills, nausea or vomiting. Social History   Social History  . Marital status: Single    Spouse name: N/A  . Number of children: N/A  . Years of education: N/A   Occupational History  . Not on file.   Social History Main Topics  . Smoking status: Current Every Day Smoker    Packs/day: 0.00    Types: Cigarettes  . Smokeless tobacco: Never Used  . Alcohol use No     Comment: former addict  . Drug use: Yes    Types: Cocaine     Comment: heroin and cocaine  . Sexual activity: Not on file   Other Topics Concern  . Not on file   Social History Narrative  . No narrative on file    Family History  Problem Relation Age of Onset  . Family history unknown: Yes    Review of Systems See HPI   Prior to Admission medications   Medication Sig Start Date End Date Taking? Authorizing Provider  Aspirin-Acetaminophen-Caffeine (GOODY HEADACHE PO) Take 1 each by mouth every 6 (six) hours as needed (pain).   Yes [provider]  ibuprofen  (ADVIL,MOTRIN) 200 MG tablet Take 400 mg by mouth every 6 (six) hours as needed for moderate pain.   Yes [provider]  vitamin B-12 (CYANOCOBALAMIN) 100 MCG tablet Take 100 mcg by mouth daily.   Yes [provider]  albuterol-ipratropium (COMBIVENT) 18-103 MCG/ACT inhaler Inhale 2 puffs into the lungs every 4 (four) hours as needed for wheezing or shortness of breath. Reported on 08/13/2015    [provider]  cloNIDine (CATAPRES) 0.1 MG tablet 1 tab po tid x 2 days, then bid x 2 days, then once daily x 2 days Patient not taking: Reported on 03/21/2017 02/25/16   Arby Barrette, MD  hydrOXYzine (ATARAX/VISTARIL) 25 MG tablet Take 1 tablet (25 mg total) by mouth every 6 (six) hours. Patient not taking: Reported on 03/21/2017 06/11/16   Renne Crigler, PA-C  Multiple Vitamin (MULTIVITAMIN WITH MINERALS) TABS tablet Take 1 tablet by mouth daily.    [provider]  mupirocin cream (BACTROBAN) 2 % Apply 1 application topically 2 (two) times daily. Patient not taking: Reported on 03/21/2017 06/11/16   Renne Crigler, PA-C  ondansetron (ZOFRAN ODT) 8 MG disintegrating tablet Take 1 tablet (8 mg total) by mouth every 8 (eight) hours as needed for nausea or vomiting. Patient not taking: Reported on 03/21/2017 09/16/15   Lorre Nick, MD  promethazine (PHENERGAN) 25 MG tablet Take 1 tablet (  25 mg total) by mouth every 6 (six) hours as needed for nausea or vomiting. Patient not taking: Reported on 03/21/2017 02/25/16   Arby BarrettePfeiffer, Marcy, MD    Past Medical, Surgical Family and Social History reviewed and updated.    Objective:   Today's Vitals   03/21/17 1522  BP: 130/90  Pulse: 100  Resp: 14  SpO2: 98%  Weight: 185 lb (83.9 kg)  Height: 5\' 9"  (1.753 m)    Wt Readings from Last 3 Encounters:  03/21/17 185 lb (83.9 kg)  03/08/17 180 lb (81.6 kg)  06/11/16 175 lb (79.4 kg)   Physical Exam  Constitutional: He is oriented to person, place, and time. He appears  well-developed and well-nourished.  HENT:  Head: Normocephalic and atraumatic.  Eyes: Pupils are equal, round, and reactive to light. Conjunctivae are normal.  Neck: Normal range of motion. Neck supple.  Cardiovascular: Normal rate, regular rhythm, normal heart sounds and intact distal pulses.   Pulmonary/Chest: Effort normal and breath sounds normal.  Abdominal: Soft. Bowel sounds are normal.    Neurological: He is alert and oriented to person, place, and time.  Skin: Skin is warm and dry.  Psychiatric: He has a normal mood and affect. His behavior is normal. Judgment and thought content normal.     Assessment & Plan:  1. Flank pain - DG Abd 1 View; Future rule out renal stone  -UA negative of hematuria   2. Hepatitis C virus infection without hepatic coma, unspecified chronicity -Referred to ID for further evaluation and treatment of hep C  Encouraged to complete Devin Randall Patient Assistance Application for GI referral   RTC: 3 months for wellness visit  Godfrey PickKimberly S. Tiburcio PeaHarris, MSN, FNP-C [facility]  [address] [place], [place] [address] [phone]

## 2017-03-22 ENCOUNTER — Telehealth: Payer: Self-pay

## 2017-03-22 LAB — POCT URINALYSIS DIP (DEVICE)
Bilirubin Urine: NEGATIVE
Glucose, UA: NEGATIVE mg/dL
HGB URINE DIPSTICK: NEGATIVE
Leukocytes, UA: NEGATIVE
Nitrite: NEGATIVE
PH: 5.5 (ref 5.0–8.0)
PROTEIN: NEGATIVE mg/dL
Specific Gravity, Urine: 1.015 (ref 1.005–1.030)
Urobilinogen, UA: 0.2 mg/dL (ref 0.0–1.0)

## 2017-03-22 NOTE — Telephone Encounter (Signed)
Called and spoke with patient. Advised that renal stone scan was normal. Advised that he should continue taking ibuprofen no more than 3 times daily as directed for low back pain. Also encouraged that patient complete the financial assistance applications so referrals can be sent. Thanks!

## 2017-03-22 NOTE — Telephone Encounter (Signed)
-----   Message from Bing NeighborsKimberly S Harris, FNP sent at 03/22/2017  8:17 AM EDT ----- Advise patient his renal stone study was normal yesterday. He should continue ibuprofen no more than 3 times daily for low back pain. Complete the financial assistance application to be referred to gastroenterologist and any additional specialities as warranted

## 2017-03-27 DIAGNOSIS — F1120 Opioid dependence, uncomplicated: Secondary | ICD-10-CM | POA: Insufficient documentation

## 2017-03-27 DIAGNOSIS — B1920 Unspecified viral hepatitis C without hepatic coma: Secondary | ICD-10-CM | POA: Insufficient documentation

## 2017-03-27 DIAGNOSIS — F1910 Other psychoactive substance abuse, uncomplicated: Secondary | ICD-10-CM | POA: Insufficient documentation

## 2017-04-03 ENCOUNTER — Emergency Department (HOSPITAL_COMMUNITY)
Admission: EM | Admit: 2017-04-03 | Discharge: 2017-04-03 | Disposition: A | Discharge status: Home / Self Care | Payer: Self-pay | Attending: Emergency Medicine | Admitting: Emergency Medicine

## 2017-04-03 ENCOUNTER — Encounter (HOSPITAL_COMMUNITY): Payer: Self-pay | Admitting: Emergency Medicine

## 2017-04-03 DIAGNOSIS — Z79899 Other long term (current) drug therapy: Secondary | ICD-10-CM | POA: Insufficient documentation

## 2017-04-03 DIAGNOSIS — R110 Nausea: Secondary | ICD-10-CM | POA: Insufficient documentation

## 2017-04-03 DIAGNOSIS — F17210 Nicotine dependence, cigarettes, uncomplicated: Secondary | ICD-10-CM | POA: Insufficient documentation

## 2017-04-03 DIAGNOSIS — L02415 Cutaneous abscess of right lower limb: Secondary | ICD-10-CM | POA: Insufficient documentation

## 2017-04-03 MED ORDER — ONDANSETRON 4 MG PO TBDP
4.0000 mg | ORAL_TABLET | Freq: Once | ORAL | Status: AC
  Administered 2017-04-03: 4 mg via ORAL
  Filled 2017-04-03: qty 1

## 2017-04-03 MED ORDER — CEPHALEXIN 500 MG PO CAPS: 500.0000 mg | ORAL_CAPSULE | Freq: Four times a day (QID) | ORAL | 0 refills | Status: DC

## 2017-04-03 MED ORDER — SULFAMETHOXAZOLE-TRIMETHOPRIM 800-160 MG PO TABS
1.0000 | ORAL_TABLET | Freq: Once | ORAL | Status: AC
  Administered 2017-04-03: 1 via ORAL
  Filled 2017-04-03: qty 1

## 2017-04-03 MED ORDER — CEPHALEXIN 500 MG PO CAPS
500.0000 mg | ORAL_CAPSULE | Freq: Once | ORAL | Status: AC
  Administered 2017-04-03: 500 mg via ORAL
  Filled 2017-04-03: qty 1

## 2017-04-03 MED ORDER — OXYCODONE-ACETAMINOPHEN 5-325 MG PO TABS
1.0000 | ORAL_TABLET | Freq: Once | ORAL | Status: AC
  Administered 2017-04-03: 1 via ORAL
  Filled 2017-04-03: qty 1

## 2017-04-03 MED ORDER — MELOXICAM 7.5 MG PO TABS: 7.5000 mg | ORAL_TABLET | Freq: Every day | ORAL | 0 refills | Status: AC

## 2017-04-03 MED ORDER — SULFAMETHOXAZOLE-TRIMETHOPRIM 800-160 MG PO TABS: 1.0000 | ORAL_TABLET | Freq: Two times a day (BID) | ORAL | 0 refills | Status: AC

## 2017-04-03 NOTE — ED Triage Notes (Signed)
Patient here from home with complaints of abscess to the back of right leg x3 days. Reports that he drained it himself last night due to pain.

## 2017-04-03 NOTE — Discharge Instructions (Signed)
Apply warm wet compresses to the area several times a day. Follow up with your doctor in 2 days for wound check. Return here sooner for any problems.

## 2017-04-03 NOTE — ED Provider Notes (Signed)
[facility]-EMERGENCY DEPT Provider Note   CSN: [id number] Arrival date & time: 04/03/17  1514     History   Chief Complaint Chief Complaint  Patient presents with  . Abscess    HPI Devin Randall is a 51 y.o. male with hx of IV drug use and polysubstance abuse who presents to the ED with an abscess to his leg. The area started 3 days ago and last night the patient states it hurt so bad he took a razor blade and opened the area so it could drain. Today the area continues to drain but the pain is worse.   The history is provided by the patient. No language interpreter was used.  Abscess  Location:  Leg Leg abscess location:  R upper leg Abscess quality: draining, painful, redness and warmth   Red streaking: no   Duration:  3 days Progression:  Worsening Pain details:    Quality:  Burning, shooting and sharp   Timing:  Constant   Progression:  Worsening Context: injected drug use   Context: not diabetes   Relieved by:  None tried Worsened by:  Nothing Associated symptoms: fever (?) and nausea   Associated symptoms: no vomiting (x1)     Past Medical History:  Diagnosis Date  . Asthma   . Cocaine abuse   . ETOH abuse   . Hepatitis C   . Heroin abuse     Patient Active Problem List   Diagnosis Date Noted  . Hepatitis C 03/27/2017  . Polysubstance abuse 03/27/2017  . Heroin addiction (HCC), history  03/27/2017    Past Surgical History:  Procedure Laterality Date  . HAND SURGERY         Home Medications    Prior to Admission medications   Medication Sig Start Date End Date Taking? Authorizing Provider  albuterol-ipratropium (COMBIVENT) 18-103 MCG/ACT inhaler Inhale 2 puffs into the lungs every 4 (four) hours as needed for wheezing or shortness of breath. Reported on 08/13/2015    [provider]  Aspirin-Acetaminophen-Caffeine (GOODY HEADACHE PO) Take 1 each by mouth every 6 (six) hours as needed (pain).    [provider]  cephALEXin (KEFLEX)  500 MG capsule Take 1 capsule (500 mg total) by mouth 4 (four) times daily. 04/03/17   Janne Napoleon, NP  cloNIDine (CATAPRES) 0.1 MG tablet 1 tab po tid x 2 days, then bid x 2 days, then once daily x 2 days Patient not taking: Reported on 03/21/2017 02/25/16   Arby Barrette, MD  hydrOXYzine (ATARAX/VISTARIL) 25 MG tablet Take 1 tablet (25 mg total) by mouth every 6 (six) hours. Patient not taking: Reported on 03/21/2017 06/11/16   Renne Crigler, PA-C  ibuprofen (ADVIL,MOTRIN) 200 MG tablet Take 400 mg by mouth every 6 (six) hours as needed for moderate pain.    [provider]  meloxicam (MOBIC) 7.5 MG tablet Take 1 tablet (7.5 mg total) by mouth daily. 04/03/17   Janne Napoleon, NP  Multiple Vitamin (MULTIVITAMIN WITH MINERALS) TABS tablet Take 1 tablet by mouth daily.    [provider]  mupirocin cream (BACTROBAN) 2 % Apply 1 application topically 2 (two) times daily. Patient not taking: Reported on 03/21/2017 06/11/16   Renne Crigler, PA-C  ondansetron (ZOFRAN ODT) 8 MG disintegrating tablet Take 1 tablet (8 mg total) by mouth every 8 (eight) hours as needed for nausea or vomiting. Patient not taking: Reported on 03/21/2017 09/16/15   Lorre Nick, MD  promethazine (PHENERGAN) 25 MG tablet Take  1 tablet (25 mg total) by mouth every 6 (six) hours as needed for nausea or vomiting. Patient not taking: Reported on 03/21/2017 02/25/16   Arby Barrette, MD  sulfamethoxazole-trimethoprim (BACTRIM DS,SEPTRA DS) 800-160 MG tablet Take 1 tablet by mouth 2 (two) times daily. 04/03/17 04/10/17  Janne Napoleon, NP  vitamin B-12 (CYANOCOBALAMIN) 100 MCG tablet Take 100 mcg by mouth daily.    [provider]    Family History Family History  Problem Relation Age of Onset  . Family history unknown: Yes    Social History Social History  Substance Use Topics  . Smoking status: Current Every Day Smoker    Packs/day: 0.00    Types: Cigarettes  . Smokeless tobacco: Never Used  .  Alcohol use No     Comment: former addict     Allergies   Penicillins   Review of Systems Review of Systems  Constitutional: Positive for chills and fever (?).  HENT: Negative.   Respiratory: Negative for chest tightness and shortness of breath.   Cardiovascular: Negative for chest pain.  Gastrointestinal: Positive for nausea. Negative for vomiting (x1).  Musculoskeletal: Positive for arthralgias.  Skin: Positive for wound.  Psychiatric/Behavioral: Negative for confusion.     Physical Exam Updated Vital Signs BP (!) 142/90 (BP Location: Right Arm)   Pulse 99   Temp 98.3 F (36.8 C) (Oral)   Resp 17   SpO2 99%   Physical Exam  Constitutional: He appears well-developed and well-nourished. No distress.  HENT:  Head: Normocephalic and atraumatic.  Eyes: EOM are normal.  Neck: Neck supple.  Cardiovascular: Normal rate and regular rhythm.   Pulmonary/Chest: Effort normal and breath sounds normal.  Musculoskeletal: Normal range of motion.  See skin exam  Neurological: He is alert.  Skin: There is erythema.  2 cm area to the posterior aspect of the right upper leg with open area that is draining. There is a 3 cm surrounding area of erythema. The area is tender with palpation.   Psychiatric: He has a normal mood and affect.  Nursing note and vitals reviewed.    ED Treatments / Results  Labs (all labs ordered are listed, but only abnormal results are displayed) Labs Reviewed - No data to display  Radiology No results found.  Procedures Procedures (including critical care time)  Medications Ordered in ED Medications  oxyCODONE-acetaminophen (PERCOCET/ROXICET) 5-325 MG per tablet 1 tablet (1 tablet Oral Given 04/03/17 1656)  ondansetron (ZOFRAN-ODT) disintegrating tablet 4 mg (4 mg Oral Given 04/03/17 1657)  cephALEXin (KEFLEX) capsule 500 mg (500 mg Oral Given 04/03/17 1656)  sulfamethoxazole-trimethoprim (BACTRIM DS,SEPTRA DS) 800-160 MG per tablet 1 tablet (1  tablet Oral Given 04/03/17 1657)     Initial Impression / Assessment and Plan / ED Course  I have reviewed the triage vital signs and the nursing notes. 51 y.o. male with abscess to the right upper leg, posterior aspect stable for d/c without fever, no red streaking and able to ambulate without difficulty. Will treat cellulitis and will treat pain with NSAIDS. Patient to f/u with his PCP in 2 days for wound check.  Final Clinical Impressions(s) / ED Diagnoses   Final diagnoses:  Abscess of right leg    New Prescriptions Discharge Medication List as of 04/03/2017  5:13 PM    START taking these medications   Details  cephALEXin (KEFLEX) 500 MG capsule Take 1 capsule (500 mg total) by mouth 4 (four) times daily., Starting Sun 04/03/2017, Print    meloxicam (  MOBIC) 7.5 MG tablet Take 1 tablet (7.5 mg total) by mouth daily., Starting Sun 04/03/2017, Print    sulfamethoxazole-trimethoprim (BACTRIM DS,SEPTRA DS) 800-160 MG tablet Take 1 tablet by mouth 2 (two) times daily., Starting Sun 04/03/2017, Until Sun 04/10/2017, Print         [place], [place], NP 04/03/17 1610    Lorre Nick, MD 04/07/17 1130

## 2017-06-20 ENCOUNTER — Ambulatory Visit: Payer: Self-pay | Admitting: Family Medicine

## 2018-01-30 ENCOUNTER — Emergency Department (HOSPITAL_BASED_OUTPATIENT_CLINIC_OR_DEPARTMENT_OTHER)
Admission: EM | Admit: 2018-01-30 | Discharge: 2018-01-30 | Disposition: A | Discharge status: Home / Self Care | Payer: Self-pay | Attending: Emergency Medicine | Admitting: Emergency Medicine

## 2018-01-30 ENCOUNTER — Encounter (HOSPITAL_BASED_OUTPATIENT_CLINIC_OR_DEPARTMENT_OTHER): Payer: Self-pay

## 2018-01-30 ENCOUNTER — Other Ambulatory Visit: Payer: Self-pay

## 2018-01-30 DIAGNOSIS — F17210 Nicotine dependence, cigarettes, uncomplicated: Secondary | ICD-10-CM | POA: Insufficient documentation

## 2018-01-30 DIAGNOSIS — L0231 Cutaneous abscess of buttock: Secondary | ICD-10-CM | POA: Insufficient documentation

## 2018-01-30 DIAGNOSIS — J45909 Unspecified asthma, uncomplicated: Secondary | ICD-10-CM | POA: Insufficient documentation

## 2018-01-30 DIAGNOSIS — Z79899 Other long term (current) drug therapy: Secondary | ICD-10-CM | POA: Insufficient documentation

## 2018-01-30 MED ORDER — SULFAMETHOXAZOLE-TRIMETHOPRIM 800-160 MG PO TABS: 1.0000 | ORAL_TABLET | Freq: Two times a day (BID) | ORAL | 0 refills | Status: AC

## 2018-01-30 MED ORDER — CEPHALEXIN 500 MG PO CAPS: 500.0000 mg | ORAL_CAPSULE | Freq: Two times a day (BID) | ORAL | 0 refills | Status: AC

## 2018-01-30 MED ORDER — SULFAMETHOXAZOLE-TRIMETHOPRIM 800-160 MG PO TABS: 1.0000 | ORAL_TABLET | Freq: Two times a day (BID) | ORAL | 0 refills | Status: DC

## 2018-01-30 MED ORDER — CEPHALEXIN 500 MG PO CAPS: 500.0000 mg | ORAL_CAPSULE | Freq: Two times a day (BID) | ORAL | 0 refills | Status: DC

## 2018-01-30 MED FILL — SULFAMETHOXAZOLE-TMP DS TAB: 800-160 | 7 days supply | Qty: 14 | Fill #0

## 2018-01-30 NOTE — Discharge Instructions (Signed)
Soak in soapy water several times a day or apply warm compresses.  Keep the wound clean.  Take both antibiotics as prescribed until all gone.  Follow-up with your doctor as needed.  Return if worsening

## 2018-01-30 NOTE — ED Triage Notes (Addendum)
C/o abscess to buttock x 1.5 months-no medical tx-states he attempted "to remove the core" without success-NAD-steady gait

## 2018-01-30 NOTE — ED Provider Notes (Signed)
[facility] EMERGENCY DEPARTMENT Provider Note   CSN: [id number] Arrival date & time: 01/30/18  1242     History   Chief Complaint Chief Complaint  Patient presents with  . Abscess    HPI Devin Randall is a 52 y.o. male.  HPI Devin Randall is a 52 y.o. male with history of alcohol abuse, cocaine abuse, presents to emergency department with complaint of an abscess to the buttocks.  Patient states he noticed a bump about a month ago.  He states the bump gradually got bigger and red and tender.  He states he was able to pop it about a week ago and states he pulled "huge core" out of it.  He has been washing it with soap and water at home, applying peroxide daily and bacitracin cream.  He states he feels like is not getting better.  He denies any fever or chills.  History of several abscesses in the past but never this bad.  He states it is still draining.  Past Medical History:  Diagnosis Date  . Asthma   . Cocaine abuse (HCC)   . ETOH abuse   . Hepatitis C   . Heroin abuse [facility])     Patient Active Problem List   Diagnosis Date Noted  . Hepatitis C 03/27/2017  . Polysubstance abuse (HCC) 03/27/2017  . Heroin addiction (HCC), history  03/27/2017    Past Surgical History:  Procedure Laterality Date  . HAND SURGERY          Home Medications    Prior to Admission medications   Medication Sig Start Date End Date Taking? Authorizing Provider  albuterol-ipratropium (COMBIVENT) 18-103 MCG/ACT inhaler Inhale 2 puffs into the lungs every 4 (four) hours as needed for wheezing or shortness of breath. Reported on 08/13/2015    [provider]  Aspirin-Acetaminophen-Caffeine (GOODY HEADACHE PO) Take 1 each by mouth every 6 (six) hours as needed (pain).    [provider]  cephALEXin (KEFLEX) 500 MG capsule Take 1 capsule (500 mg total) by mouth 4 (four) times daily. 04/03/17   Janne Napoleon, NP  cloNIDine (CATAPRES) 0.1 MG tablet 1 tab po tid x 2  days, then bid x 2 days, then once daily x 2 days Patient not taking: Reported on 03/21/2017 02/25/16   Arby Barrette, MD  hydrOXYzine (ATARAX/VISTARIL) 25 MG tablet Take 1 tablet (25 mg total) by mouth every 6 (six) hours. Patient not taking: Reported on 03/21/2017 06/11/16   Renne Crigler, PA-C  ibuprofen (ADVIL,MOTRIN) 200 MG tablet Take 400 mg by mouth every 6 (six) hours as needed for moderate pain.    [provider]  meloxicam (MOBIC) 7.5 MG tablet Take 1 tablet (7.5 mg total) by mouth daily. 04/03/17   Janne Napoleon, NP  Multiple Vitamin (MULTIVITAMIN WITH MINERALS) TABS tablet Take 1 tablet by mouth daily.    [provider]  mupirocin cream (BACTROBAN) 2 % Apply 1 application topically 2 (two) times daily. Patient not taking: Reported on 03/21/2017 06/11/16   Renne Crigler, PA-C  ondansetron (ZOFRAN ODT) 8 MG disintegrating tablet Take 1 tablet (8 mg total) by mouth every 8 (eight) hours as needed for nausea or vomiting. Patient not taking: Reported on 03/21/2017 09/16/15   Lorre Nick, MD  promethazine (PHENERGAN) 25 MG tablet Take 1 tablet (25 mg total) by mouth every 6 (six) hours as needed for nausea or vomiting. Patient not taking: Reported on 03/21/2017 02/25/16   Arby Barrette, MD  vitamin B-12 (CYANOCOBALAMIN) 100 MCG tablet Take 100 mcg by mouth daily.    [provider]    Family History Family History  Family history unknown: Yes    Social History Social History   Tobacco Use  . Smoking status: Current Some Day Smoker    Packs/day: 0.00    Types: Cigarettes  . Smokeless tobacco: Current User  Substance Use Topics  . Alcohol use: Not Currently  . Drug use: Yes    Types: Marijuana     Allergies   Penicillins   Review of Systems Review of Systems  Constitutional: Negative for chills and fever.  Skin: Positive for color change and wound.  Neurological: Negative for headaches.  All other systems reviewed and are  negative.    Physical Exam Updated Vital Signs BP (!) 158/104 (BP Location: Left Arm)   Pulse (!) 110   Temp 98.2 F (36.8 C) (Oral)   Resp 20   Ht 5\' 9"  (1.753 m)   Wt 78.5 kg (173 lb)   SpO2 96%   BMI 25.55 kg/m   Physical Exam  Constitutional: He appears well-developed and well-nourished. No distress.  Eyes: Conjunctivae are normal.  Neck: Neck supple.  Cardiovascular: Normal rate.  Pulmonary/Chest: No respiratory distress.  Abdominal: He exhibits no distension.  Skin: Skin is warm and dry.     Approximately 2 x 3 cm open wound to the left buttock, in the upper gluteal cleft, with mild surrounding erythema.  There is dry purulence in the wound, otherwise tissue appears to be normal.  There is no fluctuance, there is minimal induration.  There is no active drainage.  Nursing note and vitals reviewed.    ED Treatments / Results  Labs (all labs ordered are listed, but only abnormal results are displayed) Labs Reviewed - No data to display  EKG None  Radiology No results found.  Procedures Procedures (including critical care time)  Medications Ordered in ED Medications - No data to display   Initial Impression / Assessment and Plan / ED Course  I have reviewed the triage vital signs and the nursing notes.  Pertinent labs & imaging results that were available during my care of the patient were reviewed by me and considered in my medical decision making (see chart for details).     Patient with open abscess to the left upper gluteal cleft, there is minimal surrounding erythema, there is no active drainage however there is thick dried purulence in the wound, otherwise tissue looks normal, no necrosis.  There is no fluctuance.  I do not think he needs any further I&D on this abscess since it is already open.  We discussed wound care, including soaks or warm compresses, will start on antibiotics.  I will have him follow-up as needed.  Patient is afebrile, otherwise  nontoxic-appearing.  He stable for discharge home.  Vitals:   01/30/18 1256 01/30/18 1430  BP: (!) 158/104 (!) 133/93  Pulse: (!) 110 98  Resp: 20 18  Temp: 98.2 F (36.8 C)   TempSrc: Oral   SpO2: 96% 99%  Weight: 78.5 kg (173 lb)   Height: 5\' 9"  (1.753 m)       Final Clinical Impressions(s) / ED Diagnoses   Final diagnoses:  Abscess of buttock, left    ED Discharge Orders        Ordered    sulfamethoxazole-trimethoprim (BACTRIM DS,SEPTRA DS) 800-160 MG tablet  2 times daily,   Status:  Discontinued  01/30/18 1419    cephALEXin (KEFLEX) 500 MG capsule  2 times daily,   Status:  Discontinued     01/30/18 1419    cephALEXin (KEFLEX) 500 MG capsule  2 times daily     01/30/18 1419    sulfamethoxazole-trimethoprim (BACTRIM DS,SEPTRA DS) 800-160 MG tablet  2 times daily     01/30/18 1419       Jaynie Crumble, PA-C 01/30/18 2325    Tilden Fossa, MD 01/31/18 [phone]

## 2018-02-17 NOTE — Congregational Nurse Program (Signed)
No health concerns voiced this visit 

## 2018-03-02 NOTE — Congregational Nurse Program (Signed)
No health concerns this visit 

## 2018-03-16 NOTE — Congregational Nurse Program (Signed)
No health concerns voiced this visit 

## 2018-06-24 IMAGING — CT CT ABD-PELV W/ CM
2 of 5 series · 17 of 46 positions shown, 19 images · IV contrast (Omni 300)
Comparison: 08/23/2013

CLINICAL DATA: Right upper quadrant pain for 2 weeks.

EXAM:
CT ABDOMEN AND PELVIS WITH CONTRAST
TECHNIQUE: Multidetector CT imaging of the abdomen and pelvis was performed
using the standard protocol following bolus administration of
intravenous contrast.
CONTRAST:  100mL QZ4EDF-XBB IOPAMIDOL (QZ4EDF-XBB) INJECTION 61%

[Series 3: a/p w/ 5mm · axial · 0.85mm/px · z∈[-506,-86]mm · 14 of 96 slices shown, 16 images]
[im 6/96  soft-tissue]
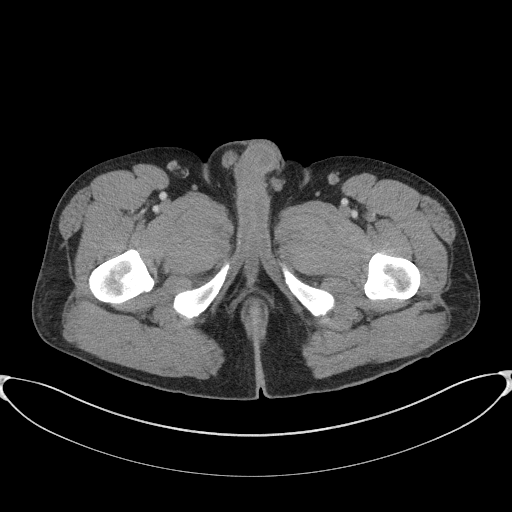
[im 6/96  bone]
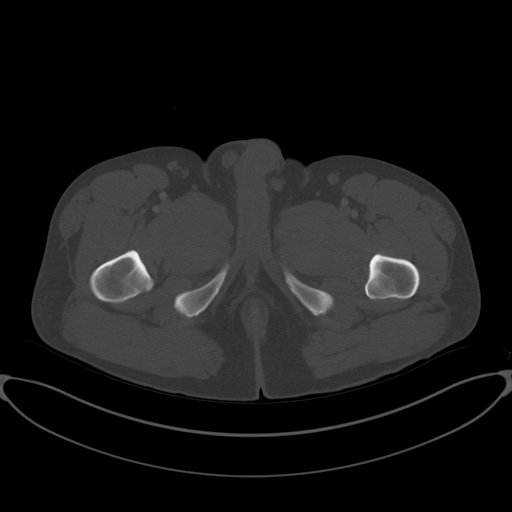
[im 12/96  soft-tissue]
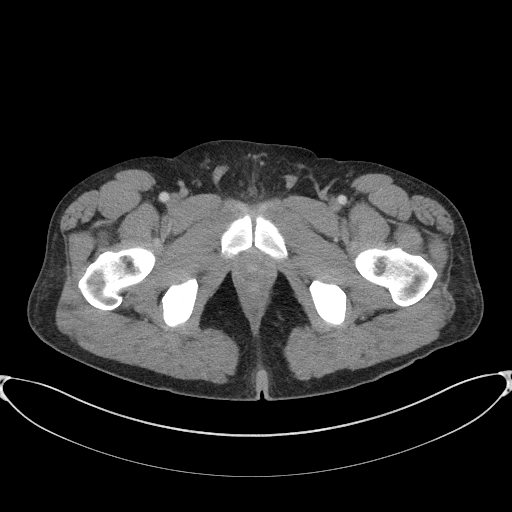
[im 17/96  soft-tissue]
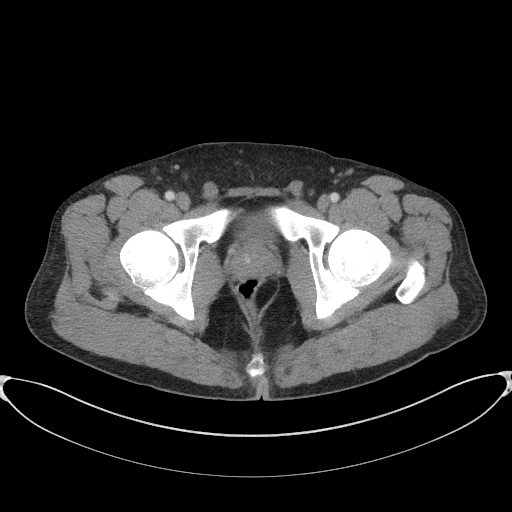
[im 28/96  soft-tissue]
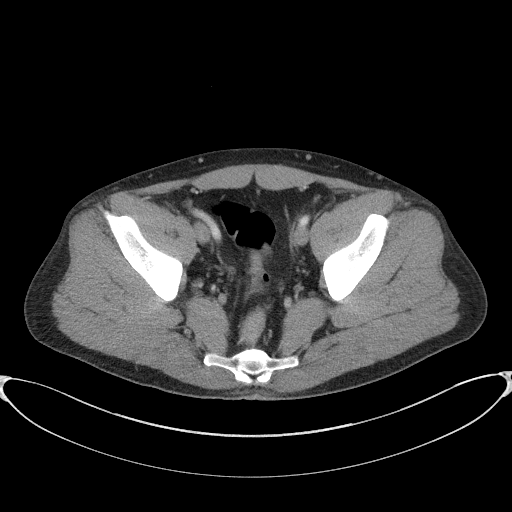
[im 34/96  soft-tissue]
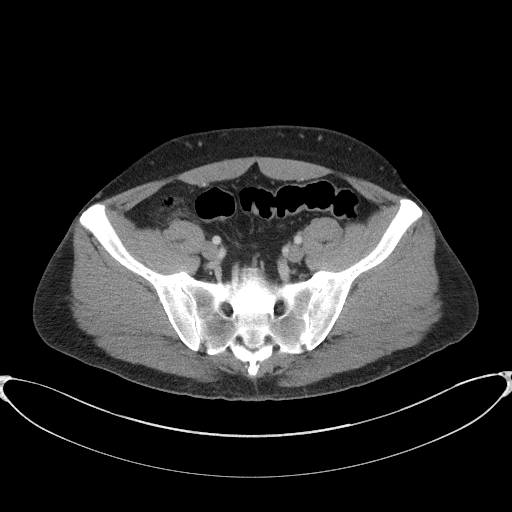
[im 40/96  soft-tissue]
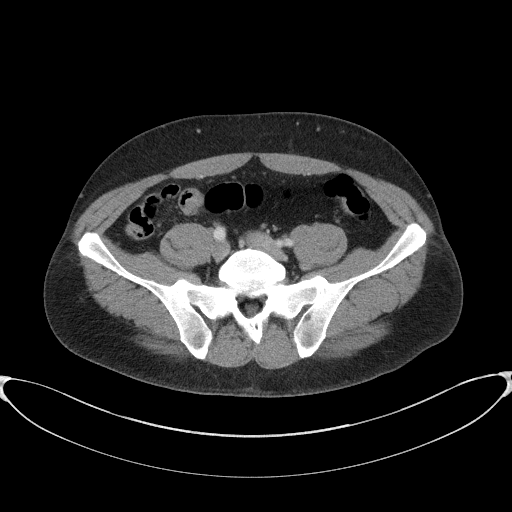
[im 45/96  soft-tissue]
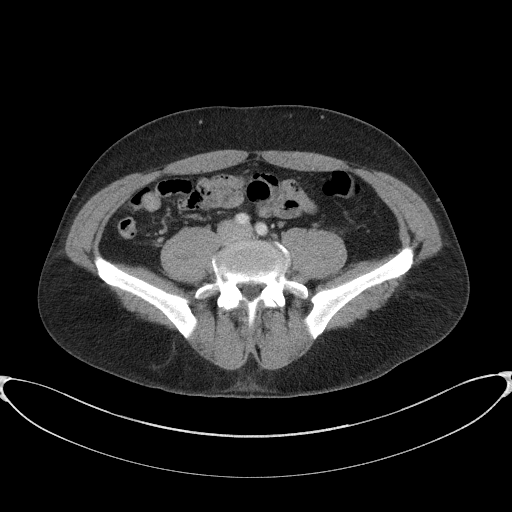
[im 51/96  soft-tissue]
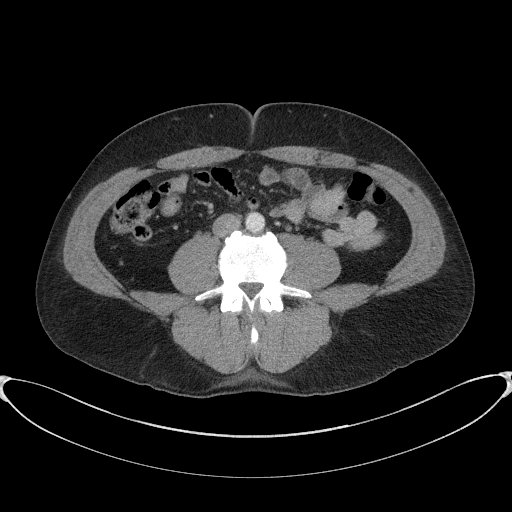
[im 56/96  soft-tissue]
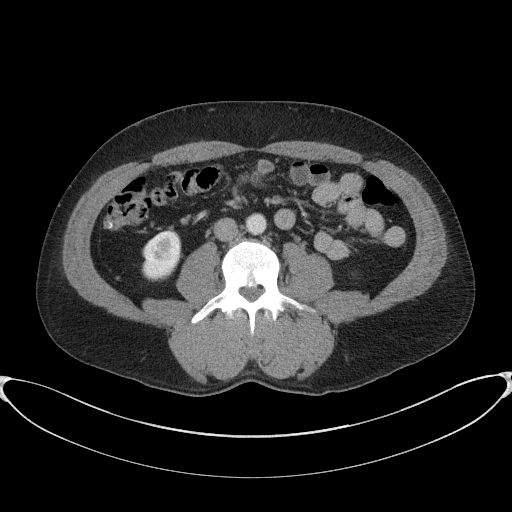
[im 56/96  bone]
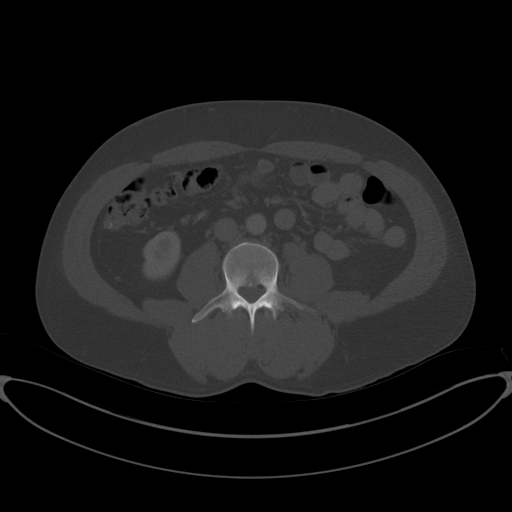
[im 62/96  soft-tissue]
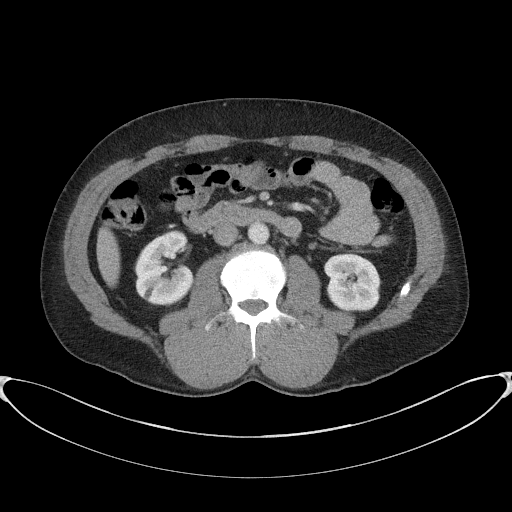
[im 73/96  soft-tissue]
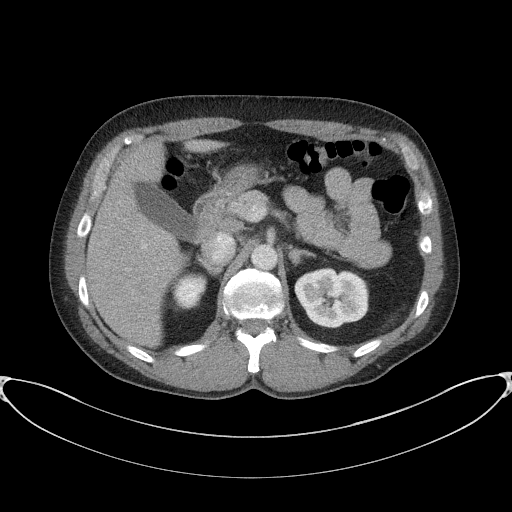
[im 79/96  soft-tissue]
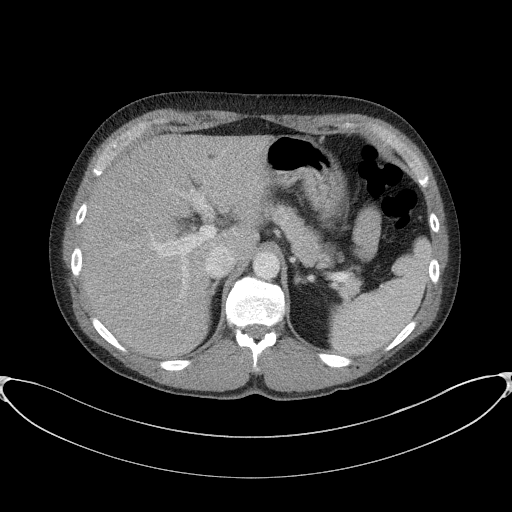
[im 84/96  soft-tissue]
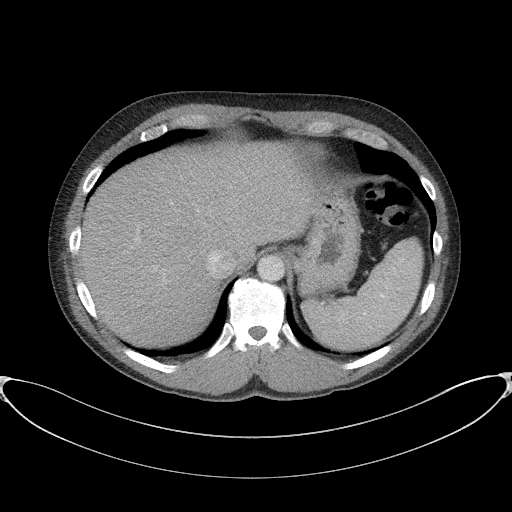
[im 90/96  soft-tissue]
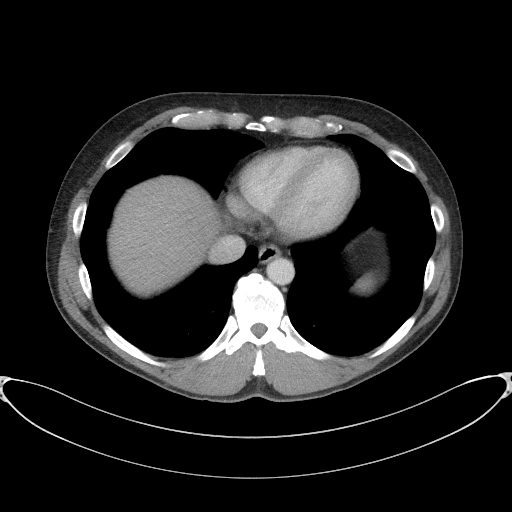

[Series 6: a/p w/ cor · coronal · 0.77mm/px · 3 of 145 slices shown]
[im 49/145  soft-tissue]
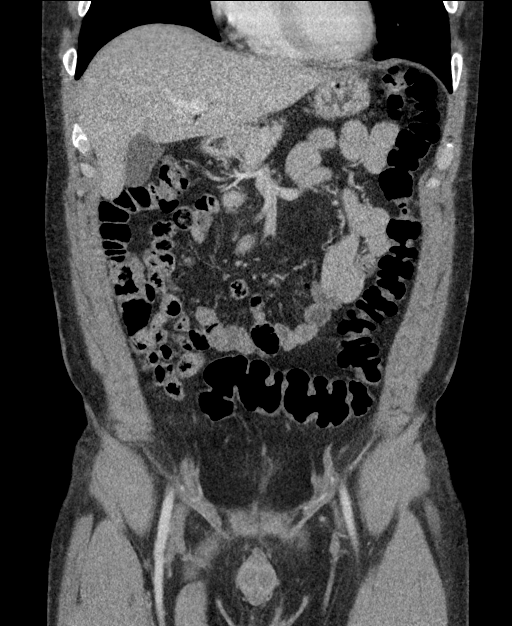
[im 65/145  soft-tissue]
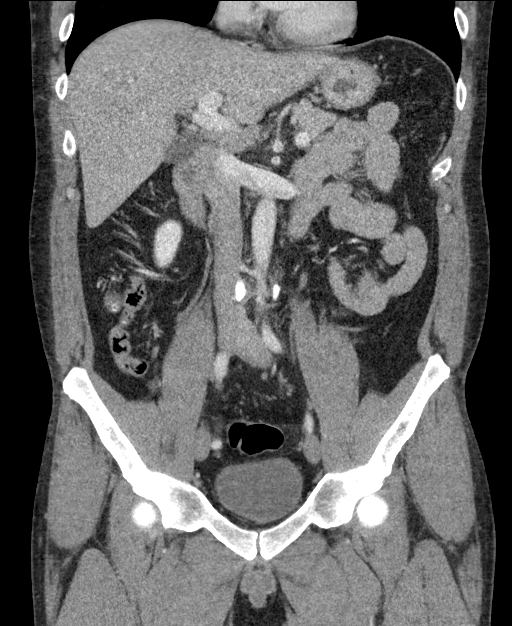
[im 81/145  soft-tissue]
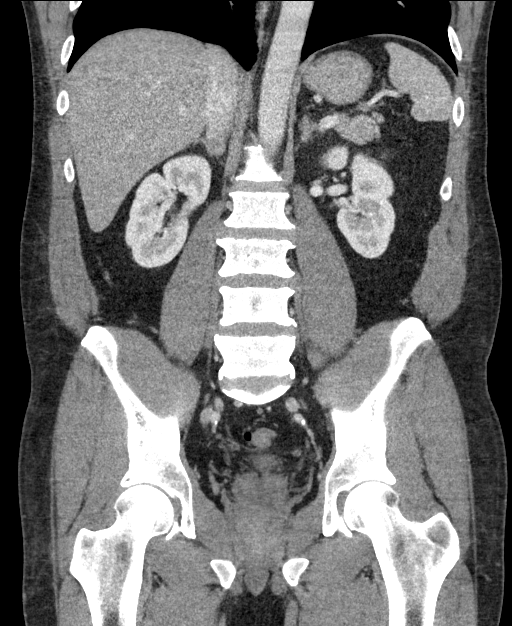

[17 of 46 positions shown; findings below may reference images not displayed]

FINDINGS: Lower chest: Lung bases are clear. No effusions. Heart is normal
size.

Hepatobiliary: Scattered hypodensities in the liver, likely small
cysts. Gallbladder unremarkable.

Pancreas: No focal abnormality or ductal dilatation.

Spleen: No focal abnormality.  Normal size.

Adrenals/Urinary Tract: No adrenal abnormality. No focal renal
abnormality. No stones or hydronephrosis. Urinary bladder is
unremarkable.

Stomach/Bowel: Appendix is normal. Stomach, large and small bowel
grossly unremarkable.

Vascular/Lymphatic: No evidence of aneurysm or adenopathy.

Reproductive: No visible focal abnormality.

Other: No free fluid or free air.

Musculoskeletal: No acute bony abnormality.
IMPRESSION: No acute findings in the abdomen or pelvis.

Normal appendix.

## 2020-04-11 DEATH — deceased
